# Patient Record
Sex: Female | Born: 1960 | Race: White | Hispanic: No | Marital: Married | Smoking: Former smoker
Health system: Southern US, Community
[De-identification: ages and names within clinical notes are randomized; demographics above are authoritative.]

## PROBLEM LIST (undated history)

## (undated) DIAGNOSIS — E119 Type 2 diabetes mellitus without complications: Secondary | ICD-10-CM

## (undated) DIAGNOSIS — K5090 Crohn's disease, unspecified, without complications: Secondary | ICD-10-CM

## (undated) HISTORY — PX: PARTIAL HYSTERECTOMY: SHX80

## (undated) HISTORY — DX: Type 2 diabetes mellitus without complications: E11.9

## (undated) HISTORY — PX: GASTRIC BYPASS: SHX52

## (undated) HISTORY — PX: CHOLECYSTECTOMY: SHX55

## (undated) HISTORY — DX: Crohn's disease, unspecified, without complications: K50.90

## (undated) HISTORY — PX: CARDIAC CATHETERIZATION: SHX172

---

## 2000-09-10 ENCOUNTER — Inpatient Hospital Stay (HOSPITAL_COMMUNITY): Admission: AD | Admit: 2000-09-10 | Discharge: 2000-09-11 | Payer: Self-pay | Admitting: Cardiology

## 2001-01-31 ENCOUNTER — Inpatient Hospital Stay (HOSPITAL_COMMUNITY): Admission: EM | Admit: 2001-01-31 | Discharge: 2001-02-01 | Payer: Self-pay | Admitting: Emergency Medicine

## 2002-04-20 ENCOUNTER — Encounter: Payer: Self-pay | Admitting: Pediatrics

## 2002-04-20 ENCOUNTER — Ambulatory Visit (HOSPITAL_COMMUNITY): Admission: RE | Admit: 2002-04-20 | Discharge: 2002-04-20 | Payer: Self-pay | Admitting: *Deleted

## 2002-04-25 ENCOUNTER — Ambulatory Visit (HOSPITAL_COMMUNITY): Admission: RE | Admit: 2002-04-25 | Discharge: 2002-04-25 | Payer: Self-pay | Admitting: Interventional Radiology

## 2002-06-02 ENCOUNTER — Observation Stay (HOSPITAL_COMMUNITY): Admission: RE | Admit: 2002-06-02 | Discharge: 2002-06-03 | Payer: Self-pay | Admitting: Interventional Radiology

## 2003-10-15 ENCOUNTER — Encounter: Payer: Self-pay | Admitting: Cardiology

## 2003-10-15 ENCOUNTER — Ambulatory Visit (HOSPITAL_COMMUNITY): Admission: RE | Admit: 2003-10-15 | Discharge: 2003-10-15 | Payer: Self-pay | Admitting: Cardiology

## 2006-06-17 ENCOUNTER — Ambulatory Visit: Payer: Self-pay | Admitting: Cardiology

## 2006-06-17 ENCOUNTER — Inpatient Hospital Stay (HOSPITAL_COMMUNITY): Admission: EM | Admit: 2006-06-17 | Discharge: 2006-06-18 | Payer: Self-pay | Admitting: Emergency Medicine

## 2012-02-19 DIAGNOSIS — I219 Acute myocardial infarction, unspecified: Secondary | ICD-10-CM

## 2012-02-19 HISTORY — DX: Acute myocardial infarction, unspecified: I21.9

## 2015-10-01 DIAGNOSIS — E785 Hyperlipidemia, unspecified: Secondary | ICD-10-CM

## 2015-10-01 DIAGNOSIS — I10 Essential (primary) hypertension: Secondary | ICD-10-CM

## 2015-10-01 DIAGNOSIS — I25119 Atherosclerotic heart disease of native coronary artery with unspecified angina pectoris: Secondary | ICD-10-CM

## 2015-10-01 HISTORY — DX: Atherosclerotic heart disease of native coronary artery with unspecified angina pectoris: I25.119

## 2015-10-01 HISTORY — DX: Hyperlipidemia, unspecified: E78.5

## 2015-10-01 HISTORY — DX: Essential (primary) hypertension: I10

## 2015-10-04 DIAGNOSIS — I200 Unstable angina: Secondary | ICD-10-CM

## 2015-10-04 DIAGNOSIS — R079 Chest pain, unspecified: Secondary | ICD-10-CM

## 2015-10-04 DIAGNOSIS — I209 Angina pectoris, unspecified: Secondary | ICD-10-CM | POA: Insufficient documentation

## 2015-10-04 HISTORY — DX: Angina pectoris, unspecified: I20.9

## 2015-10-04 HISTORY — DX: Chest pain, unspecified: R07.9

## 2015-10-04 HISTORY — DX: Unstable angina: I20.0

## 2020-01-04 ENCOUNTER — Ambulatory Visit (INDEPENDENT_AMBULATORY_CARE_PROVIDER_SITE_OTHER): Payer: Medicaid Other | Admitting: Cardiology

## 2020-01-04 ENCOUNTER — Other Ambulatory Visit: Payer: Self-pay

## 2020-01-04 ENCOUNTER — Encounter: Payer: Self-pay | Admitting: *Deleted

## 2020-01-04 VITALS — BP 150/80 | HR 67 | Ht 62.5 in | Wt 179.0 lb

## 2020-01-04 DIAGNOSIS — E108 Type 1 diabetes mellitus with unspecified complications: Secondary | ICD-10-CM

## 2020-01-04 DIAGNOSIS — I10 Essential (primary) hypertension: Secondary | ICD-10-CM | POA: Diagnosis not present

## 2020-01-04 DIAGNOSIS — I25119 Atherosclerotic heart disease of native coronary artery with unspecified angina pectoris: Secondary | ICD-10-CM | POA: Diagnosis not present

## 2020-01-04 DIAGNOSIS — Z01812 Encounter for preprocedural laboratory examination: Secondary | ICD-10-CM

## 2020-01-04 DIAGNOSIS — R0602 Shortness of breath: Secondary | ICD-10-CM

## 2020-01-04 HISTORY — DX: Type 1 diabetes mellitus with unspecified complications: E10.8

## 2020-01-04 HISTORY — DX: Shortness of breath: R06.02

## 2020-01-04 MED ORDER — LISINOPRIL 5 MG PO TABS: 5.0000 mg | ORAL_TABLET | Freq: Every day | ORAL | 3 refills | Status: DC

## 2020-01-04 MED ORDER — RANOLAZINE ER 500 MG PO TB12: 500.0000 mg | ORAL_TABLET | Freq: Two times a day (BID) | ORAL | 1 refills | Status: DC

## 2020-01-04 NOTE — Patient Instructions (Addendum)
Medication Instructions:  Your physician has recommended you make the following change in your medication:   INCREASE: Lisinopril to 5 mg Take 1 tab daily( may take 2 tabs of 2.5 mg daily unitl you run out)  START; Ranexa 500 mg Take 1 tab Twice dialy  *If you need a refill on your cardiac medications before your next appointment, please call your pharmacy*  Lab Work: Your physician recommends that you return for lab work in: [place] CBC,BMP  If you have labs (blood work) drawn today and your tests are completely normal, you will receive your results only by: Marland Kitchen MyChart Message (if you have MyChart) OR . A paper copy in the mail If you have any lab test that is abnormal or we need to change your treatment, we will call you to review the results.  Testing/Procedures: Your physician has requested that you have an echocardiogram. Echocardiography is a painless test that uses sound waves to create images of your heart. It provides your doctor with information about the size and shape of your heart and how well your heart's chambers and valves are working. This procedure takes approximately one hour. There are no restrictions for this procedure.     [place] [address] [address] Dept: [phone] Loc: [phone]  Kelsey Gillespie  01/04/2020  You are scheduled for a Cardiac Catheterization on Thursday, January 21 with Dr. Glenetta Hew.  1. Please arrive at the [facility] (Main Entrance A) at [facility]: [address] [place], [place] [address] at 5:30 AM (This time is two hours before your procedure to ensure your preparation). Free valet parking service is available.   Special note: Every effort is made to have your procedure done on time. Please understand that emergencies sometimes delay scheduled procedures.  2. Diet: Do not eat solid foods after midnight.  The patient may have  clear liquids until 5am upon the day of the procedure.  3. Labs: You will need to have a COVID screen. Your appointment is on Mon Jan 18th at 1:55PM at [facility]   4. Medication instructions in preparation for your procedure:   Contrast Allergy: No  Take only 50 units of insulin the night before your procedure. Do not take any insulin on the day of the procedure.  Do not take Diabetes Med Glucophage (Metformin) on the day of the procedure and HOLD 48 HOURS AFTER THE PROCEDURE.  On the morning of your procedure, take your Aspirin and any morning medicines NOT listed above.  You may use sips of water.  5. Plan for one night stay--bring personal belongings. 6. Bring a current list of your medications and current insurance cards. 7. You MUST have a responsible person to drive you home. 8. Someone MUST be with you the first 24 hours after you arrive home or your discharge will be delayed. 9. Please wear clothes that are easy to get on and off and wear slip-on shoes.  Thank you for allowing [place] to care for you!   -- [place] [facility]  Follow-Up: At [facility], you and your health needs are our priority.  As part of our continuing mission to provide you with exceptional heart care, we have created designated Provider Care Teams.  These Care Teams include your primary Cardiologist (physician) and Advanced Practice Providers (APPs -  Physician Assistants and Nurse Practitioners) who all work together to provide  you with the care you need, when you need it.  Your next appointment:   1 month(s)  The format for your next appointment:   In Person  Provider:   Thomasene Ripple, DO  Other Instructions

## 2020-01-04 NOTE — Progress Notes (Signed)
Cardiology Office Note:    Date:  01/04/2020   ID:  Kelsey Gillespie, DOB [birth year], MRN [id number]  PCP:  Penelope Coop, FNP  Cardiologist:  No primary care provider on file.  Electrophysiologist:  None   Referring MD: Ocie Doyne., MD   Chief Complaint  Patient presents with  . Coronary Artery Disease    History of Present Illness:    Kelsey Gillespie is a 59 y.o. female with a hx of coronary disease status post stent to the RCA in 2013, per records in 2016 she was cath which showed nonobstructive CAD with mild restenosis of the RCA, and left circumflex artery with mild stenosis.  She was recommended to continue medical management.  Her medical history also includes diabetes, hypertension and hyperlipidemia.  The patient was referred by her PCP to be reevaluated by cardiology due to more frequent intermittent chest pain.  She tells me for the last month or 2 she has been experiencing left-sided squeezing pain which radiated up and down her left chest wall.  She admits to associated shortness of breath.  Last several weeks he has been taking nitroglycerin at least twice every week.  He has concerned about this as he started to feel as the symptoms she experienced prior to her myocardial infarction.  She denies any lightheadedness or dizziness.   Past Medical History:  Diagnosis Date  . Angina pectoris ([place]) 10/04/2015  . Chest pain 10/04/2015  . Coronary artery disease involving native coronary artery with angina pectoris ([place]) 10/01/2015   PCI for acute RCA occlusion 03/09/12 EF 55% Cardiac cath 10/04/15:Conclusions Diagnostic Procedure Summary 1. Nonobstructive CAD. Mild restenosis of RCA and LCx. 2. Low-normal LV systolic function, EF 10% 3. Inferior hypokinesis. Diagnostic Procedure Recommendations Consider noncardiac causes of the patient's symptoms. Continue medical therapy for nonobstructive CAD. Signatures  . Crohn disease ([place])   . Diabetes ([place])   . Essential hypertension  10/01/2015  . Heart attack ([place]) 02/2012  . Hyperlipidemia 10/01/2015    Past Surgical History:  Procedure Laterality Date  . CARDIAC CATHETERIZATION     1 stent placed  . CESAREAN SECTION    . CHOLECYSTECTOMY    . GASTRIC BYPASS    . PARTIAL HYSTERECTOMY      Current Medications: Current Meds  Medication Sig  . ALPRAZolam (XANAX) 1 MG tablet Take 1 mg by mouth as needed.  Marland Kitchen aspirin EC 81 MG tablet Take 81 mg by mouth daily.  . carvedilol (COREG) 3.125 MG tablet Take 40 mg by mouth daily.  . Exenatide ER (BYDUREON) 2 MG PEN Inject into the skin once a week.  . gabapentin (NEURONTIN) 300 MG capsule Take 300 mg by mouth 3 (three) times daily.  . insulin detemir (LEVEMIR) 100 UNIT/ML injection Inject into the skin daily.  . metFORMIN (GLUCOPHAGE) 500 MG tablet Take 1,000 mg by mouth 2 (two) times daily.  . nitroGLYCERIN (NITROSTAT) 0.4 MG SL tablet Place 0.4 mg under the tongue every 5 (five) minutes as needed for chest pain.  Marland Kitchen omeprazole (PRILOSEC) 40 MG capsule Take 40 mg by mouth 2 (two) times daily.  . [DISCONTINUED] lisinopril (ZESTRIL) 2.5 MG tablet Take 2.5 mg by mouth daily.     Allergies:   Codeine   Social History   Socioeconomic History  . Marital status: Married    Spouse name: Not on file  . Number of children: Not on file  . Years of education: Not on file  . Highest education level: Not on  file  Occupational History  . Not on file  Tobacco Use  . Smoking status: Former Smoker    Types: Cigarettes    Quit date: 01/03/1995    Years since quitting: 25.0  . Smokeless tobacco: Never Used  Substance and Sexual Activity  . Alcohol use: Never  . Drug use: Never  . Sexual activity: Not on file  Other Topics Concern  . Not on file  Social History Narrative  . Not on file   Social Determinants of Health   Financial Resource Strain:   . Difficulty of Paying Living Expenses: Not on file  Food Insecurity:   . Worried About Programme researcher, broadcasting/film/video in the Last  Year: Not on file  . Ran Out of Food in the Last Year: Not on file  Transportation Needs:   . Lack of Transportation (Medical): Not on file  . Lack of Transportation (Non-Medical): Not on file  Physical Activity:   . Days of Exercise per Week: Not on file  . Minutes of Exercise per Session: Not on file  Stress:   . Feeling of Stress : Not on file  Social Connections:   . Frequency of Communication with Friends and Family: Not on file  . Frequency of Social Gatherings with Friends and Family: Not on file  . Attends Religious Services: Not on file  . Active Member of Clubs or Organizations: Not on file  . Attends Banker Meetings: Not on file  . Marital Status: Not on file     Family History: The patient's family history includes Atrial fibrillation in her brother; Diabetes in her maternal grandfather and mother; Heart attack in her father and maternal grandmother; Heart disease in her brother; Stroke in her mother.  ROS:   Review of Systems  Constitution: Negative for decreased appetite, fever and weight gain.  HENT: Negative for congestion, ear discharge, hoarse voice and sore throat.   Eyes: Negative for discharge, redness, vision loss in right eye and visual halos.  Cardiovascular: Reports for chest pain, dyspnea on exertion.  Negative for leg swelling, orthopnea and palpitations.  Respiratory: Negative for cough, hemoptysis, shortness of breath and snoring.   Endocrine: Negative for heat intolerance and polyphagia.  Hematologic/Lymphatic: Negative for bleeding problem. Does not bruise/bleed easily.  Skin: Negative for flushing, nail changes, rash and suspicious lesions.  Musculoskeletal: Negative for arthritis, joint pain, muscle cramps, myalgias, neck pain and stiffness.  Gastrointestinal: Negative for abdominal pain, bowel incontinence, diarrhea and excessive appetite.  Genitourinary: Negative for decreased libido, genital sores and incomplete emptying.    Neurological: Negative for brief paralysis, focal weakness, headaches and loss of balance.  Psychiatric/Behavioral: Negative for altered mental status, depression and suicidal ideas.  Allergic/Immunologic: Negative for HIV exposure and persistent infections.    EKGs/Labs/Other Studies Reviewed:    The following studies were reviewed today:   EKG:  The ekg ordered today demonstrates sinus rhythm, heart rate 67 bpm, T wave inversion in the inferior leads consistent with inferior wall ischemia.  No prior EKG for comparison.  Recent Labs: No results found for requested labs within last 8760 hours.  Recent Lipid Panel No results found for: CHOL, TRIG, HDL, CHOLHDL, VLDL, LDLCALC, LDLDIRECT  Physical Exam:    VS:  BP (!) 150/80 (BP Location: Left Arm, Patient Position: Sitting, Cuff Size: Normal)   Pulse 67   Ht 5' 2.5" (1.588 m)   Wt 179 lb (81.2 kg)   SpO2 99%   BMI 32.22 kg/m  Wt Readings from Last 3 Encounters:  01/04/20 179 lb (81.2 kg)     GEN: Well nourished, well developed in no acute distress HEENT: Normal NECK: No JVD; No carotid bruits LYMPHATICS: No lymphadenopathy CARDIAC: S1S2 noted,RRR, no murmurs, rubs, gallops RESPIRATORY:  Clear to auscultation without rales, wheezing or rhonchi  ABDOMEN: Soft, non-tender, non-distended, +bowel sounds, no guarding. EXTREMITIES: No edema, No cyanosis, no clubbing MUSCULOSKELETAL:  No edema; No deformity  SKIN: Warm and dry NEUROLOGIC:  Alert and oriented x 3, non-focal PSYCHIATRIC:  Normal affect, good insight  ASSESSMENT:    1. Coronary artery disease involving native coronary artery with angina pectoris, unspecified whether native or transplanted heart (HCC)   2. Essential hypertension   3. Shortness of breath   4. Pre-procedure lab exam   5. Type 1 diabetes mellitus with complications (HCC)    PLAN:    1.  Coronary artery disease status post stents-her symptoms are consistent with chronic stable angina.  I am  going to add ranolazine 500 mg twice daily to the patient's medication regimen to help this.  In addition given her risk factors significantly high and with nonobstructive coronary artery disease already noted on 2016 left heart cath the most appropriate ischemic evaluation for this patient will be a left heart catheterization.  The patient understands that risks include but are not limited to stroke (1 in 1000), death (1 in 1000), kidney failure [usually temporary] (1 in 500), bleeding (1 in 200), allergic reaction [possibly serious] (1 in 200), and agrees to proceed.  2.  For completed for shortness of breath we will reassess her LV function and assessing for any structural abnormalities by getting a transthoracic echocardiogram.  Per record in 2016 her EF was 50%.  3.  She is hypertensive in the office today, therefore I am going to increase her lisinopril to 5 mg daily.  I have encouraged the patient to increase her fluid intake especially with her upcoming left heart catheterization.   The patient is in agreement with the above plan. The patient left the office in stable condition.  The patient will follow up in 1 month.   Medication Adjustments/Labs and Tests Ordered: Current medicines are reviewed at length with the patient today.  Concerns regarding medicines are outlined above.  Orders Placed This Encounter  Procedures  . CBC  . Basic Metabolic Panel (BMET)  . EKG 12-Lead  . ECHOCARDIOGRAM COMPLETE   Meds ordered this encounter  Medications  . lisinopril (ZESTRIL) 5 MG tablet    Sig: Take 1 tablet (5 mg total) by mouth daily.    Dispense:  90 tablet    Refill:  3  . ranolazine (RANEXA) 500 MG 12 hr tablet    Sig: Take 1 tablet (500 mg total) by mouth 2 (two) times daily.    Dispense:  180 tablet    Refill:  1    Patient Instructions  Medication Instructions:  Your physician has recommended you make the following change in your medication:   INCREASE: Lisinopril to 5 mg  Take 1 tab daily( may take 2 tabs of 2.5 mg daily unitl you run out)  START; Ranexa 500 mg Take 1 tab Twice dialy  *If you need a refill on your cardiac medications before your next appointment, please call your pharmacy*  Lab Work: Your physician recommends that you return for lab work in: TODAY CBC,BMP  If you have labs (blood work) drawn today and your tests are completely normal, you will receive  your results only by: Marland Kitchen MyChart Message (if you have MyChart) OR . A paper copy in the mail If you have any lab test that is abnormal or we need to change your treatment, we will call you to review the results.  Testing/Procedures: Your physician has requested that you have an echocardiogram. Echocardiography is a painless test that uses sound waves to create images of your heart. It provides your doctor with information about the size and shape of your heart and how well your heart's chambers and valves are working. This procedure takes approximately one hour. There are no restrictions for this procedure.     [facility] CARDIOVASCULAR DIVISION [facility] AT [place] [address] [place] [address] Dept: [phone] Loc: [phone]  Jiah Bari  01/04/2020  You are scheduled for a Cardiac Catheterization on Thursday, January 21 with Dr. Bryan Lemma.  1. Please arrive at the [facility] (Main Entrance A) at [facility]: [address] [place], [place] [address] at 5:30 AM (This time is two hours before your procedure to ensure your preparation). Free valet parking service is available.   Special note: Every effort is made to have your procedure done on time. Please understand that emergencies sometimes delay scheduled procedures.  2. Diet: Do not eat solid foods after midnight.  The patient may have clear liquids until 5am upon the day of the procedure.  3. Labs: You will need to have a COVID screen. Your appointment is on Mon Jan 18th  at 1:55PM at [address] [facility] [place]  4. Medication instructions in preparation for your procedure:   Contrast Allergy: No  Take only 50 units of insulin the night before your procedure. Do not take any insulin on the day of the procedure.  Do not take Diabetes Med Glucophage (Metformin) on the day of the procedure and HOLD 48 HOURS AFTER THE PROCEDURE.  On the morning of your procedure, take your Aspirin and any morning medicines NOT listed above.  You may use sips of water.  5. Plan for one night stay--bring personal belongings. 6. Bring a current list of your medications and current insurance cards. 7. You MUST have a responsible person to drive you home. 8. Someone MUST be with you the first 24 hours after you arrive home or your discharge will be delayed. 9. Please wear clothes that are easy to get on and off and wear slip-on shoes.  Thank you for allowing [place] to care for you!   -- [place] [facility]  Follow-Up: At [facility], you and your health needs are our priority.  As part of our continuing mission to provide you with exceptional heart care, we have created designated Provider Care Teams.  These Care Teams include your primary Cardiologist (physician) and Advanced Practice Providers (APPs -  Physician Assistants and Nurse Practitioners) who all work together to provide you with the care you need, when you need it.  Your next appointment:   1 month(s)  The format for your next appointment:   In Person  Provider:   Thomasene Ripple, DO  Other Instructions      Adopting a Healthy Lifestyle.  Know what a healthy weight is for you (roughly BMI <25) and aim to maintain this   Aim for 7+ servings of fruits and vegetables daily   65-80+ fluid ounces of water or unsweet tea for healthy kidneys   Limit to max 1 drink of alcohol per  day; avoid smoking/tobacco   Limit animal fats in diet for cholesterol and heart health - choose grass  fed whenever available   Avoid highly processed foods, and foods high in saturated/trans fats   Aim for low stress - take time to unwind and care for your mental health   Aim for 150 min of moderate intensity exercise weekly for heart health, and weights twice weekly for bone health   Aim for 7-9 hours of sleep daily   When it comes to diets, agreement about the perfect plan isnt easy to find, even among the experts. Experts at the [facility] [employer] developed an idea known as the Healthy Eating Plate. Just imagine a plate divided into logical, healthy portions.   The emphasis is on diet quality:   Load up on vegetables and fruits - one-half of your plate: Aim for color and variety, and remember that potatoes dont count.   Go for whole grains - one-quarter of your plate: Whole wheat, barley, wheat berries, quinoa, oats, Melody rice, and foods made with them. If you want pasta, go with whole wheat pasta.   Protein power - one-quarter of your plate: Fish, chicken, beans, and nuts are all healthy, versatile protein sources. Limit red meat.   The diet, however, does go beyond the plate, offering a few other suggestions.   Use healthy plant oils, such as olive, canola, soy, corn, sunflower and peanut. Check the labels, and avoid partially hydrogenated oil, which have unhealthy trans fats.   If youre thirsty, drink water. Coffee and tea are good in moderation, but skip sugary drinks and limit milk and dairy products to one or two daily servings.   The type of carbohydrate in the diet is more important than the amount. Some sources of carbohydrates, such as vegetables, fruits, whole grains, and beans-are healthier than others.   Finally, stay active  Signed, Thomasene Ripple, DO  01/04/2020 12:11 PM    [facility]

## 2020-01-04 NOTE — H&P (View-Only) (Signed)
Cardiology Office Note:    Date:  01/04/2020   ID:  Kelsey Gillespie, DOB [birth year], MRN [id number]  PCP:  Kelsey Coop, FNP  Cardiologist:  No primary care provider on file.  Electrophysiologist:  None   Referring MD: Kelsey Gillespie., MD   Chief Complaint  Patient presents with  . Coronary Artery Disease    History of Present Illness:    Kelsey Gillespie is a 59 y.o. female with a hx of coronary disease status post stent to the RCA in 2013, per records in 2016 she was cath which showed nonobstructive CAD with mild restenosis of the RCA, and left circumflex artery with mild stenosis.  She was recommended to continue medical management.  Her medical history also includes diabetes, hypertension and hyperlipidemia.  The patient was referred by her PCP to be reevaluated by cardiology due to more frequent intermittent chest pain.  She tells me for the last month or 2 she has been experiencing left-sided squeezing pain which radiated up and down her left chest wall.  She admits to associated shortness of breath.  Last several weeks he has been taking nitroglycerin at least twice every week.  He has concerned about this as he started to feel as the symptoms she experienced prior to her myocardial infarction.  She denies any lightheadedness or dizziness.   Past Medical History:  Diagnosis Date  . Angina pectoris ([place]) 10/04/2015  . Chest pain 10/04/2015  . Coronary artery disease involving native coronary artery with angina pectoris ([place]) 10/01/2015   PCI for acute RCA occlusion 03/09/12 EF 55% Cardiac cath 10/04/15:Conclusions Diagnostic Procedure Summary 1. Nonobstructive CAD. Mild restenosis of RCA and LCx. 2. Low-normal LV systolic function, EF 10% 3. Inferior hypokinesis. Diagnostic Procedure Recommendations Consider noncardiac causes of the patient's symptoms. Continue medical therapy for nonobstructive CAD. Signatures  . Crohn disease ([place])   . Diabetes ([place])   . Essential hypertension  10/01/2015  . Heart attack ([place]) 02/2012  . Hyperlipidemia 10/01/2015    Past Surgical History:  Procedure Laterality Date  . CARDIAC CATHETERIZATION     1 stent placed  . CESAREAN SECTION    . CHOLECYSTECTOMY    . GASTRIC BYPASS    . PARTIAL HYSTERECTOMY      Current Medications: Current Meds  Medication Sig  . ALPRAZolam (XANAX) 1 MG tablet Take 1 mg by mouth as needed.  Marland Kitchen aspirin EC 81 MG tablet Take 81 mg by mouth daily.  . carvedilol (COREG) 3.125 MG tablet Take 40 mg by mouth daily.  . Exenatide ER (BYDUREON) 2 MG PEN Inject into the skin once a week.  . gabapentin (NEURONTIN) 300 MG capsule Take 300 mg by mouth 3 (three) times daily.  . insulin detemir (LEVEMIR) 100 UNIT/ML injection Inject into the skin daily.  . metFORMIN (GLUCOPHAGE) 500 MG tablet Take 1,000 mg by mouth 2 (two) times daily.  . nitroGLYCERIN (NITROSTAT) 0.4 MG SL tablet Place 0.4 mg under the tongue every 5 (five) minutes as needed for chest pain.  Marland Kitchen omeprazole (PRILOSEC) 40 MG capsule Take 40 mg by mouth 2 (two) times daily.  . [DISCONTINUED] lisinopril (ZESTRIL) 2.5 MG tablet Take 2.5 mg by mouth daily.     Allergies:   Codeine   Social History   Socioeconomic History  . Marital status: Married    Spouse name: Not on file  . Number of children: Not on file  . Years of education: Not on file  . Highest education level: Not on  file  Occupational History  . Not on file  Tobacco Use  . Smoking status: Former Smoker    Types: Cigarettes    Quit date: 01/03/1995    Years since quitting: 25.0  . Smokeless tobacco: Never Used  Substance and Sexual Activity  . Alcohol use: Never  . Drug use: Never  . Sexual activity: Not on file  Other Topics Concern  . Not on file  Social History Narrative  . Not on file   Social Determinants of Health   Financial Resource Strain:   . Difficulty of Paying Living Expenses: Not on file  Food Insecurity:   . Worried About Programme researcher, broadcasting/film/video in the Last  Year: Not on file  . Ran Out of Food in the Last Year: Not on file  Transportation Needs:   . Lack of Transportation (Medical): Not on file  . Lack of Transportation (Non-Medical): Not on file  Physical Activity:   . Days of Exercise per Week: Not on file  . Minutes of Exercise per Session: Not on file  Stress:   . Feeling of Stress : Not on file  Social Connections:   . Frequency of Communication with Friends and Family: Not on file  . Frequency of Social Gatherings with Friends and Family: Not on file  . Attends Religious Services: Not on file  . Active Member of Clubs or Organizations: Not on file  . Attends Banker Meetings: Not on file  . Marital Status: Not on file     Family History: The patient's family history includes Atrial fibrillation in her brother; Diabetes in her maternal grandfather and mother; Heart attack in her father and maternal grandmother; Heart disease in her brother; Stroke in her mother.  ROS:   Review of Systems  Constitution: Negative for decreased appetite, fever and weight gain.  HENT: Negative for congestion, ear discharge, hoarse voice and sore throat.   Eyes: Negative for discharge, redness, vision loss in right eye and visual halos.  Cardiovascular: Reports for chest pain, dyspnea on exertion.  Negative for leg swelling, orthopnea and palpitations.  Respiratory: Negative for cough, hemoptysis, shortness of breath and snoring.   Endocrine: Negative for heat intolerance and polyphagia.  Hematologic/Lymphatic: Negative for bleeding problem. Does not bruise/bleed easily.  Skin: Negative for flushing, nail changes, rash and suspicious lesions.  Musculoskeletal: Negative for arthritis, joint pain, muscle cramps, myalgias, neck pain and stiffness.  Gastrointestinal: Negative for abdominal pain, bowel incontinence, diarrhea and excessive appetite.  Genitourinary: Negative for decreased libido, genital sores and incomplete emptying.    Neurological: Negative for brief paralysis, focal weakness, headaches and loss of balance.  Psychiatric/Behavioral: Negative for altered mental status, depression and suicidal ideas.  Allergic/Immunologic: Negative for HIV exposure and persistent infections.    EKGs/Labs/Other Studies Reviewed:    The following studies were reviewed today:   EKG:  The ekg ordered today demonstrates sinus rhythm, heart rate 67 bpm, T wave inversion in the inferior leads consistent with inferior wall ischemia.  No prior EKG for comparison.  Recent Labs: No results found for requested labs within last 8760 hours.  Recent Lipid Panel No results found for: CHOL, TRIG, HDL, CHOLHDL, VLDL, LDLCALC, LDLDIRECT  Physical Exam:    VS:  BP (!) 150/80 (BP Location: Left Arm, Patient Position: Sitting, Cuff Size: Normal)   Pulse 67   Ht 5' 2.5" (1.588 m)   Wt 179 lb (81.2 kg)   SpO2 99%   BMI 32.22 kg/m  Wt Readings from Last 3 Encounters:  01/04/20 179 lb (81.2 kg)     GEN: Well nourished, well developed in no acute distress HEENT: Normal NECK: No JVD; No carotid bruits LYMPHATICS: No lymphadenopathy CARDIAC: S1S2 noted,RRR, no murmurs, rubs, gallops RESPIRATORY:  Clear to auscultation without rales, wheezing or rhonchi  ABDOMEN: Soft, non-tender, non-distended, +bowel sounds, no guarding. EXTREMITIES: No edema, No cyanosis, no clubbing MUSCULOSKELETAL:  No edema; No deformity  SKIN: Warm and dry NEUROLOGIC:  Alert and oriented x 3, non-focal PSYCHIATRIC:  Normal affect, good insight  ASSESSMENT:    1. Coronary artery disease involving native coronary artery with angina pectoris, unspecified whether native or transplanted heart (HCC)   2. Essential hypertension   3. Shortness of breath   4. Pre-procedure lab exam   5. Type 1 diabetes mellitus with complications (HCC)    PLAN:    1.  Coronary artery disease status post stents-her symptoms are consistent with chronic stable angina.  I am  going to add ranolazine 500 mg twice daily to the patient's medication regimen to help this.  In addition given her risk factors significantly high and with nonobstructive coronary artery disease already noted on 2016 left heart cath the most appropriate ischemic evaluation for this patient will be a left heart catheterization.  The patient understands that risks include but are not limited to stroke (1 in 1000), death (1 in 1000), kidney failure [usually temporary] (1 in 500), bleeding (1 in 200), allergic reaction [possibly serious] (1 in 200), and agrees to proceed.  2.  For completed for shortness of breath we will reassess her LV function and assessing for any structural abnormalities by getting a transthoracic echocardiogram.  Per record in 2016 her EF was 50%.  3.  She is hypertensive in the office today, therefore I am going to increase her lisinopril to 5 mg daily.  I have encouraged the patient to increase her fluid intake especially with her upcoming left heart catheterization.   The patient is in agreement with the above plan. The patient left the office in stable condition.  The patient will follow up in 1 month.   Medication Adjustments/Labs and Tests Ordered: Current medicines are reviewed at length with the patient today.  Concerns regarding medicines are outlined above.  Orders Placed This Encounter  Procedures  . CBC  . Basic Metabolic Panel (BMET)  . EKG 12-Lead  . ECHOCARDIOGRAM COMPLETE   Meds ordered this encounter  Medications  . lisinopril (ZESTRIL) 5 MG tablet    Sig: Take 1 tablet (5 mg total) by mouth daily.    Dispense:  90 tablet    Refill:  3  . ranolazine (RANEXA) 500 MG 12 hr tablet    Sig: Take 1 tablet (500 mg total) by mouth 2 (two) times daily.    Dispense:  180 tablet    Refill:  1    Patient Instructions  Medication Instructions:  Your physician has recommended you make the following change in your medication:   INCREASE: Lisinopril to 5 mg  Take 1 tab daily( may take 2 tabs of 2.5 mg daily unitl you run out)  START; Ranexa 500 mg Take 1 tab Twice dialy  *If you need a refill on your cardiac medications before your next appointment, please call your pharmacy*  Lab Work: Your physician recommends that you return for lab work in: TODAY CBC,BMP  If you have labs (blood work) drawn today and your tests are completely normal, you will receive  your results only by: . MyChart Message (if you have MyChart) OR . A paper copy in the mail If you have any lab test that is abnormal or we need to change your treatment, we will call you to review the results.  Testing/Procedures: Your physician has requested that you have an echocardiogram. Echocardiography is a painless test that uses sound waves to create images of your heart. It provides your doctor with information about the size and shape of your heart and how well your heart's chambers and valves are working. This procedure takes approximately one hour. There are no restrictions for this procedure.     [facility] [address] [place] [address] Dept: [phone] Loc: [phone]  Kelsey Gillespie  01/04/2020  You are scheduled for a Cardiac Catheterization on Thursday, January 21 with Dr. David Harding.  1. Please arrive at the [facility] (Main Entrance A) at [facility]: [address] [place], [place] [address] at 5:30 AM (This time is two hours before your procedure to ensure your preparation). Free valet parking service is available.   Special note: Every effort is made to have your procedure done on time. Please understand that emergencies sometimes delay scheduled procedures.  2. Diet: Do not eat solid foods after midnight.  The patient may have clear liquids until 5am upon the day of the procedure.  3. Labs: You will need to have a COVID screen. Your appointment is on Mon Jan 18th  at 1:55PM at [address] [place]  4. Medication instructions in preparation for your procedure:   Contrast Allergy: No  Take only 50 units of insulin the night before your procedure. Do not take any insulin on the day of the procedure.  Do not take Diabetes Med Glucophage (Metformin) on the day of the procedure and HOLD 48 HOURS AFTER THE PROCEDURE.  On the morning of your procedure, take your Aspirin and any morning medicines NOT listed above.  You may use sips of water.  5. Plan for one night stay--bring personal belongings. 6. Bring a current list of your medications and current insurance cards. 7. You MUST have a responsible person to drive you home. 8. Someone MUST be with you the first 24 hours after you arrive home or your discharge will be delayed. 9. Please wear clothes that are easy to get on and off and wear slip-on shoes.  Thank you for allowing us to care for you!   -- [facility]  Follow-Up: At [facility], you and your health needs are our priority.  As part of our continuing mission to provide you with exceptional heart care, we have created designated Provider Care Teams.  These Care Teams include your primary Cardiologist (physician) and Advanced Practice Providers (APPs -  Physician Assistants and Nurse Practitioners) who all work together to provide you with the care you need, when you need it.  Your next appointment:   1 month(s)  The format for your next appointment:   In Person  Provider:   Kelsey Pizano, DO  Other Instructions      Adopting a Healthy Lifestyle.  Know what a healthy weight is for you (roughly BMI <25) and aim to maintain this   Aim for 7+ servings of fruits and vegetables daily   65-80+ fluid ounces of water or unsweet tea for healthy kidneys   Limit to max 1 drink of alcohol per   day; avoid smoking/tobacco   Limit animal fats in diet for cholesterol and heart health - choose grass  fed whenever available   Avoid highly processed foods, and foods high in saturated/trans fats   Aim for low stress - take time to unwind and care for your mental health   Aim for 150 min of moderate intensity exercise weekly for heart health, and weights twice weekly for bone health   Aim for 7-9 hours of sleep daily   When it comes to diets, agreement about the perfect plan isnt easy to find, even among the experts. Experts at the [facility] [employer] developed an idea known as the Healthy Eating Plate. Just imagine a plate divided into logical, healthy portions.   The emphasis is on diet quality:   Load up on vegetables and fruits - one-half of your plate: Aim for color and variety, and remember that potatoes dont count.   Go for whole grains - one-quarter of your plate: Whole wheat, barley, wheat berries, quinoa, oats, Melody rice, and foods made with them. If you want pasta, go with whole wheat pasta.   Protein power - one-quarter of your plate: Fish, chicken, beans, and nuts are all healthy, versatile protein sources. Limit red meat.   The diet, however, does go beyond the plate, offering a few other suggestions.   Use healthy plant oils, such as olive, canola, soy, corn, sunflower and peanut. Check the labels, and avoid partially hydrogenated oil, which have unhealthy trans fats.   If youre thirsty, drink water. Coffee and tea are good in moderation, but skip sugary drinks and limit milk and dairy products to one or two daily servings.   The type of carbohydrate in the diet is more important than the amount. Some sources of carbohydrates, such as vegetables, fruits, whole grains, and beans-are healthier than others.   Finally, stay active  Signed, Kelsey Ripple, DO  01/04/2020 12:11 PM    [facility]

## 2020-01-05 LAB — CBC
Hematocrit: 35.4 % (ref 34.0–46.6)
Hemoglobin: 11.9 g/dL (ref 11.1–15.9)
MCH: 27.4 pg (ref 26.6–33.0)
MCHC: 33.6 g/dL (ref 31.5–35.7)
MCV: 81 fL (ref 79–97)
Platelets: 291 10*3/uL (ref 150–450)
RBC: 4.35 x10E6/uL (ref 3.77–5.28)
RDW: 13.3 % (ref 11.7–15.4)
WBC: 5.8 10*3/uL (ref 3.4–10.8)

## 2020-01-05 LAB — BASIC METABOLIC PANEL
BUN/Creatinine Ratio: 15 (ref 9–23)
BUN: 9 mg/dL (ref 6–24)
CO2: 25 mmol/L (ref 20–29)
Calcium: 9.6 mg/dL (ref 8.7–10.2)
Chloride: 105 mmol/L (ref 96–106)
Creatinine, Ser: 0.62 mg/dL (ref 0.57–1.00)
GFR calc Af Amer: 116 mL/min/{1.73_m2} (ref >59)
GFR calc non Af Amer: 100 mL/min/{1.73_m2} (ref >59)
Glucose: 116 mg/dL — ABNORMAL HIGH (ref 65–99)
Potassium: 4.3 mmol/L (ref 3.5–5.2)
Sodium: 142 mmol/L (ref 134–144)

## 2020-01-08 ENCOUNTER — Other Ambulatory Visit (HOSPITAL_COMMUNITY)
Admission: RE | Admit: 2020-01-08 | Discharge: 2020-01-08 | Disposition: A | Discharge status: Home / Self Care | Payer: Medicaid Other | Source: Ambulatory Visit | Attending: Cardiology | Admitting: Cardiology

## 2020-01-08 DIAGNOSIS — Z01812 Encounter for preprocedural laboratory examination: Secondary | ICD-10-CM | POA: Insufficient documentation

## 2020-01-08 DIAGNOSIS — Z20822 Contact with and (suspected) exposure to covid-19: Secondary | ICD-10-CM | POA: Insufficient documentation

## 2020-01-09 ENCOUNTER — Telehealth: Payer: Self-pay | Admitting: *Deleted

## 2020-01-09 LAB — NOVEL CORONAVIRUS, NAA (HOSP ORDER, SEND-OUT TO REF LAB; TAT 18-24 HRS): SARS-CoV-2, NAA: NOT DETECTED

## 2020-01-09 NOTE — Telephone Encounter (Signed)
Pt contacted pre-catheterization scheduled at [facility] for: Thursday January 21,2021 7:30 AM Verified arrival time and place: [facility] Main Entrance A [facility]) at: 5:30 AM   No solid food after midnight prior to cath, clear liquids until 5 AM day of procedure. Contrast allergy: no  Hold: Insulin-1/2 usual insulin PM prior to procedure. Insulin-AM of procedure-I do not see AM Insulin on medication list. Metformin-day of procedure and 48 hours post procedure   Except hold medications AM meds can be  taken pre-cath with sip of water including: ASA 81 mg   Confirmed patient has responsible adult to drive home post procedure and observe 24 hours after arriving home:   Currently, due to Covid-19 pandemic, only one support person will be allowed with patient. Must be the same support person for that patient's entire stay and will be required to wear a mask. They will be asked to wait in the waiting room for the duration of the patient's stay.  Patients are required to wear a mask when they enter the hospital.  01/09/20 unable to complete call to review procedure instructions, received message party unable to accept call.

## 2020-01-10 NOTE — Telephone Encounter (Signed)
I attempted to contact patient to review procedure instructions, received message at home number listed not a working number, received message mobile number listed does not receive anonymous calls, unable to speak with patient.

## 2020-01-11 ENCOUNTER — Other Ambulatory Visit: Payer: Self-pay

## 2020-01-11 ENCOUNTER — Ambulatory Visit (HOSPITAL_COMMUNITY)
Admission: RE | Admit: 2020-01-11 | Discharge: 2020-01-11 | Disposition: A | Discharge status: Home / Self Care | Payer: Medicaid Other | Attending: Cardiology | Admitting: Cardiology

## 2020-01-11 ENCOUNTER — Ambulatory Visit (HOSPITAL_COMMUNITY)
Admission: RE | Disposition: A | Discharge status: Home / Self Care | Payer: Self-pay | Source: Home / Self Care | Attending: Cardiology

## 2020-01-11 DIAGNOSIS — Z7982 Long term (current) use of aspirin: Secondary | ICD-10-CM | POA: Insufficient documentation

## 2020-01-11 DIAGNOSIS — I25118 Atherosclerotic heart disease of native coronary artery with other forms of angina pectoris: Secondary | ICD-10-CM | POA: Diagnosis not present

## 2020-01-11 DIAGNOSIS — I25119 Atherosclerotic heart disease of native coronary artery with unspecified angina pectoris: Secondary | ICD-10-CM | POA: Diagnosis present

## 2020-01-11 DIAGNOSIS — I252 Old myocardial infarction: Secondary | ICD-10-CM | POA: Diagnosis not present

## 2020-01-11 DIAGNOSIS — I10 Essential (primary) hypertension: Secondary | ICD-10-CM | POA: Insufficient documentation

## 2020-01-11 DIAGNOSIS — Z955 Presence of coronary angioplasty implant and graft: Secondary | ICD-10-CM | POA: Diagnosis not present

## 2020-01-11 DIAGNOSIS — Z79899 Other long term (current) drug therapy: Secondary | ICD-10-CM | POA: Insufficient documentation

## 2020-01-11 DIAGNOSIS — E109 Type 1 diabetes mellitus without complications: Secondary | ICD-10-CM | POA: Insufficient documentation

## 2020-01-11 DIAGNOSIS — R0602 Shortness of breath: Secondary | ICD-10-CM | POA: Diagnosis not present

## 2020-01-11 DIAGNOSIS — Z87891 Personal history of nicotine dependence: Secondary | ICD-10-CM | POA: Diagnosis not present

## 2020-01-11 DIAGNOSIS — E785 Hyperlipidemia, unspecified: Secondary | ICD-10-CM | POA: Insufficient documentation

## 2020-01-11 DIAGNOSIS — Z794 Long term (current) use of insulin: Secondary | ICD-10-CM | POA: Insufficient documentation

## 2020-01-11 DIAGNOSIS — I200 Unstable angina: Secondary | ICD-10-CM | POA: Diagnosis present

## 2020-01-11 DIAGNOSIS — Z885 Allergy status to narcotic agent status: Secondary | ICD-10-CM | POA: Diagnosis not present

## 2020-01-11 HISTORY — PX: LEFT HEART CATH AND CORONARY ANGIOGRAPHY: CATH118249

## 2020-01-11 LAB — GLUCOSE, CAPILLARY: Glucose-Capillary: 149 mg/dL — ABNORMAL HIGH (ref 70–99)

## 2020-01-11 SURGERY — LEFT HEART CATH AND CORONARY ANGIOGRAPHY
Anesthesia: LOCAL

## 2020-01-11 MED ORDER — LIDOCAINE HCL (PF) 1 % IJ SOLN
INTRAMUSCULAR | Status: AC
  Filled 2020-01-11: qty 30

## 2020-01-11 MED ORDER — HEPARIN (PORCINE) IN NACL 1000-0.9 UT/500ML-% IV SOLN
INTRAVENOUS | Status: AC
  Filled 2020-01-11: qty 1000

## 2020-01-11 MED ORDER — HEPARIN (PORCINE) IN NACL 1000-0.9 UT/500ML-% IV SOLN
INTRAVENOUS | Status: DC | PRN
  Administered 2020-01-11 (×2): 500 mL

## 2020-01-11 MED ORDER — MIDAZOLAM HCL 2 MG/2ML IJ SOLN
INTRAMUSCULAR | Status: DC | PRN
  Administered 2020-01-11 (×2): 1 mg via INTRAVENOUS

## 2020-01-11 MED ORDER — SODIUM CHLORIDE 0.9% FLUSH: 3.0000 mL | INTRAVENOUS | Status: DC | PRN

## 2020-01-11 MED ORDER — VERAPAMIL HCL 2.5 MG/ML IV SOLN
INTRAVENOUS | Status: DC | PRN
  Administered 2020-01-11: 10 mL via INTRA_ARTERIAL

## 2020-01-11 MED ORDER — SODIUM CHLORIDE 0.9 % WEIGHT BASED INFUSION: 1.0000 mL/kg/h | INTRAVENOUS | Status: DC

## 2020-01-11 MED ORDER — HYDRALAZINE HCL 20 MG/ML IJ SOLN: 10.0000 mg | INTRAMUSCULAR | Status: DC | PRN

## 2020-01-11 MED ORDER — ONDANSETRON HCL 4 MG/2ML IJ SOLN: 4.0000 mg | Freq: Four times a day (QID) | INTRAMUSCULAR | Status: DC | PRN

## 2020-01-11 MED ORDER — SODIUM CHLORIDE 0.9 % IV SOLN: 250.0000 mL | INTRAVENOUS | Status: DC | PRN

## 2020-01-11 MED ORDER — VERAPAMIL HCL 2.5 MG/ML IV SOLN
INTRAVENOUS | Status: AC
  Filled 2020-01-11: qty 2

## 2020-01-11 MED ORDER — IOHEXOL 350 MG/ML SOLN
INTRAVENOUS | Status: DC | PRN
  Administered 2020-01-11: 100 mL

## 2020-01-11 MED ORDER — ASPIRIN 81 MG PO CHEW
CHEWABLE_TABLET | ORAL | Status: AC
  Administered 2020-01-11: 07:00:00 81 mg
  Filled 2020-01-11: qty 1

## 2020-01-11 MED ORDER — FENTANYL CITRATE (PF) 100 MCG/2ML IJ SOLN
INTRAMUSCULAR | Status: AC
  Filled 2020-01-11: qty 2

## 2020-01-11 MED ORDER — SODIUM CHLORIDE 0.9% FLUSH: 3.0000 mL | Freq: Two times a day (BID) | INTRAVENOUS | Status: DC

## 2020-01-11 MED ORDER — FENTANYL CITRATE (PF) 100 MCG/2ML IJ SOLN
INTRAMUSCULAR | Status: DC | PRN
  Administered 2020-01-11 (×2): 25 ug via INTRAVENOUS

## 2020-01-11 MED ORDER — LABETALOL HCL 5 MG/ML IV SOLN: 10.0000 mg | INTRAVENOUS | Status: DC | PRN

## 2020-01-11 MED ORDER — HEPARIN SODIUM (PORCINE) 1000 UNIT/ML IJ SOLN
INTRAMUSCULAR | Status: AC
  Filled 2020-01-11: qty 1

## 2020-01-11 MED ORDER — ACETAMINOPHEN 325 MG PO TABS: 650.0000 mg | ORAL_TABLET | ORAL | Status: DC | PRN

## 2020-01-11 MED ORDER — HEPARIN SODIUM (PORCINE) 1000 UNIT/ML IJ SOLN
INTRAMUSCULAR | Status: DC | PRN
  Administered 2020-01-11: 4000 [IU] via INTRAVENOUS

## 2020-01-11 MED ORDER — SODIUM CHLORIDE 0.9 % WEIGHT BASED INFUSION
3.0000 mL/kg/h | INTRAVENOUS | Status: DC
  Administered 2020-01-11: 06:00:00 3 mL/kg/h via INTRAVENOUS

## 2020-01-11 MED ORDER — LIDOCAINE HCL (PF) 1 % IJ SOLN
INTRAMUSCULAR | Status: DC | PRN
  Administered 2020-01-11: 2 mL via INTRADERMAL

## 2020-01-11 MED ORDER — SODIUM CHLORIDE 0.9 % IV SOLN: INTRAVENOUS | Status: DC

## 2020-01-11 MED ORDER — MIDAZOLAM HCL 2 MG/2ML IJ SOLN
INTRAMUSCULAR | Status: AC
  Filled 2020-01-11: qty 2

## 2020-01-11 SURGICAL SUPPLY — 10 items
CATH INFINITI 5FR ANG PIGTAIL (CATHETERS) ×2 IMPLANT
CATH OPTITORQUE TIG 4.0 5F (CATHETERS) ×2 IMPLANT
DEVICE RAD COMP TR BAND LRG (VASCULAR PRODUCTS) ×2 IMPLANT
GLIDESHEATH SLEND SS 6F .021 (SHEATH) ×2 IMPLANT
GUIDEWIRE INQWIRE 1.5J.035X260 (WIRE) ×1 IMPLANT
INQWIRE 1.5J .035X260CM (WIRE) ×2
KIT HEART LEFT (KITS) ×2 IMPLANT
PACK CARDIAC CATHETERIZATION (CUSTOM PROCEDURE TRAY) ×2 IMPLANT
TRANSDUCER W/STOPCOCK (MISCELLANEOUS) ×2 IMPLANT
TUBING CIL FLEX 10 FLL-RA (TUBING) ×2 IMPLANT

## 2020-01-11 NOTE — Progress Notes (Signed)
Discharge instructions reviewed with pt voices understanding. Attempted to call her husband message left.

## 2020-01-11 NOTE — Progress Notes (Signed)
Discharge instructions reviewed with pt husband voices understanding.  

## 2020-01-11 NOTE — Interval H&P Note (Signed)
History and Physical Interval Note:  01/11/2020 7:30 AM  Kelsey Gillespie  has presented today for surgery, with the diagnosis of CAD -with new onset anginal pain over the last 2 -3 weeks associated with exertional dyspnea.  This would not be considered stable angina, would be considered unstable versus progressive angina over the last month, since she was previously stable since 2016.    The various methods of treatment have been discussed with the patient and family. After consideration of risks, benefits and other options for treatment, the patient has consented to  Procedure(s): LEFT HEART CATH AND CORONARY ANGIOGRAPHY (N/A)  PERCUTANEOUS CORONARY INTERVENTION  as a surgical intervention.  The patient's history has been reviewed, patient examined, no change in status, stable for surgery.  I have reviewed the patient's chart and labs.  Questions were answered to the patient's satisfaction.     Cath Lab Visit (complete for each Cath Lab visit)  Clinical Evaluation Leading to the Procedure:   ACS: No. -New onset pain  Non-ACS:    Anginal Classification: CCS III  Anti-ischemic medical therapy: Maximal Therapy (2 or more classes of medications)  Non-Invasive Test Results: No non-invasive testing performed  Prior CABG: No previous CABG   Bryan Lemma

## 2020-01-11 NOTE — Progress Notes (Signed)
Ambulated in hallway to bathroom to void tol well

## 2020-01-11 NOTE — Discharge Instructions (Signed)
Radial Site Care  This sheet gives you information about how to care for yourself after your procedure. Your health care provider may also give you more specific instructions. If you have problems or questions, contact your health care provider. What can I expect after the procedure? After the procedure, it is common to have:  Bruising and tenderness at the catheter insertion area. Follow these instructions at home: Medicines  Take over-the-counter and prescription medicines only as told by your health care provider. Insertion site care  Follow instructions from your health care provider about how to take care of your insertion site. Make sure you: ? Wash your hands with soap and water before you change your bandage (dressing). If soap and water are not available, use hand sanitizer. ? Change your dressing as told by your health care provider. ? Leave stitches (sutures), skin glue, or adhesive strips in place. These skin closures may need to stay in place for 2 weeks or longer. If adhesive strip edges start to loosen and curl up, you may trim the loose edges. Do not remove adhesive strips completely unless your health care provider tells you to do that.  Check your insertion site every day for signs of infection. Check for: ? Redness, swelling, or pain. ? Fluid or blood. ? Pus or a bad smell. ? Warmth.  Do not take baths, swim, or use a hot tub until your health care provider approves.  You may shower 24-48 hours after the procedure, or as directed by your health care provider. ? Remove the dressing and gently wash the site with plain soap and water. ? Pat the area dry with a clean towel. ? Do not rub the site. That could cause bleeding.  Do not apply powder or lotion to the site. Activity   For 24 hours after the procedure, or as directed by your health care provider: ? Do not flex or bend the affected arm. ? Do not push or pull heavy objects with the affected arm. ? Do not  drive yourself home from the hospital or clinic. You may drive 24 hours after the procedure unless your health care provider tells you not to. ? Do not operate machinery or power tools.  Do not lift anything that is heavier than 10 lb (4.5 kg), or the limit that you are told, until your health care provider says that it is safe.  Ask your health care provider when it is okay to: ? Return to work or school. ? Resume usual physical activities or sports. ? Resume sexual activity. General instructions  If the catheter site starts to bleed, raise your arm and put firm pressure on the site. If the bleeding does not stop, get help right away. This is a medical emergency.  If you went home on the same day as your procedure, a responsible adult should be with you for the first 24 hours after you arrive home.  Keep all follow-up visits as told by your health care provider. This is important. Contact a health care provider if:  You have a fever.  You have redness, swelling, or yellow drainage around your insertion site. Get help right away if:  You have unusual pain at the radial site.  The catheter insertion area swells very fast.  The insertion area is bleeding, and the bleeding does not stop when you hold steady pressure on the area.  Your arm or hand becomes pale, cool, tingly, or numb. These symptoms may represent a serious problem   that is an emergency. Do not wait to see if the symptoms will go away. Get medical help right away. Call your local emergency services (911 in the [place] Do not drive yourself to the hospital. Summary  After the procedure, it is common to have bruising and tenderness at the site.  Follow instructions from your health care provider about how to take care of your radial site wound. Check the wound every day for signs of infection.  Do not lift anything that is heavier than 10 lb (4.5 kg), or the limit that you are told, until your health care provider says  that it is safe. This information is not intended to replace advice given to you by your health care provider. Make sure you discuss any questions you have with your health care provider. Document Revised: 01/12/2018 Document Reviewed: 01/12/2018 Elsevier Patient Education  2020 [employer]  

## 2020-01-12 MED FILL — Heparin Sodium (Porcine) Inj 1000 Unit/ML: INTRAMUSCULAR | Qty: 10 | Status: AC

## 2020-02-05 ENCOUNTER — Ambulatory Visit: Payer: Medicaid Other | Admitting: Cardiology

## 2020-02-16 ENCOUNTER — Other Ambulatory Visit: Payer: Self-pay

## 2020-02-16 ENCOUNTER — Encounter: Payer: Self-pay | Admitting: Cardiology

## 2020-02-16 ENCOUNTER — Ambulatory Visit (INDEPENDENT_AMBULATORY_CARE_PROVIDER_SITE_OTHER): Payer: Medicaid Other | Admitting: Cardiology

## 2020-02-16 VITALS — BP 130/82 | HR 82 | Ht 62.5 in | Wt 171.0 lb

## 2020-02-16 DIAGNOSIS — I10 Essential (primary) hypertension: Secondary | ICD-10-CM | POA: Diagnosis not present

## 2020-02-16 DIAGNOSIS — I25119 Atherosclerotic heart disease of native coronary artery with unspecified angina pectoris: Secondary | ICD-10-CM

## 2020-02-16 DIAGNOSIS — E782 Mixed hyperlipidemia: Secondary | ICD-10-CM

## 2020-02-16 DIAGNOSIS — E669 Obesity, unspecified: Secondary | ICD-10-CM

## 2020-02-16 DIAGNOSIS — E108 Type 1 diabetes mellitus with unspecified complications: Secondary | ICD-10-CM | POA: Diagnosis not present

## 2020-02-16 HISTORY — DX: Obesity, unspecified: E66.9

## 2020-02-16 MED ORDER — RANOLAZINE ER 500 MG PO TB12: 500.0000 mg | ORAL_TABLET | Freq: Two times a day (BID) | ORAL | 1 refills | Status: DC

## 2020-02-16 MED ORDER — ATORVASTATIN CALCIUM 40 MG PO TABS: 40.0000 mg | ORAL_TABLET | Freq: Every day | ORAL | 1 refills | Status: DC

## 2020-02-16 MED ORDER — CARVEDILOL 3.125 MG PO TABS: 3.1250 mg | ORAL_TABLET | Freq: Two times a day (BID) | ORAL | 1 refills | Status: AC

## 2020-02-16 NOTE — Patient Instructions (Addendum)
Medication Instructions:  Your physician recommends that you continue on your current medications as directed. Please refer to the Current Medication list given to you today.  *If you need a refill on your cardiac medications before your next appointment, please call your pharmacy*   Lab Work: None If you have labs (blood work) drawn today and your tests are completely normal, you will receive your results only by: MyChart Message (if you have MyChart) OR A paper copy in the mail If you have any lab test that is abnormal or we need to change your treatment, we will call you to review the results.   Testing/Procedures: NOne   Follow-Up: At [facility], you and your health needs are our priority.  As part of our continuing mission to provide you with exceptional heart care, we have created designated Provider Care Teams.  These Care Teams include your primary Cardiologist (physician) and Advanced Practice Providers (APPs -  Physician Assistants and Nurse Practitioners) who all work together to provide you with the care you need, when you need it.  We recommend signing up for the patient portal called "MyChart".  Sign up information is provided on this After Visit Summary.  MyChart is used to connect with patients for Virtual Visits (Telemedicine).  Patients are able to view lab/test results, encounter notes, upcoming appointments, etc.  Non-urgent messages can be sent to your provider as well.   To learn more about what you can do with MyChart, go to [email or web address].    Your next appointment:   6 month(s)  The format for your next appointment:   In Person  Provider:   Kardie Tobb, DO   Other Instructions   

## 2020-02-16 NOTE — Progress Notes (Signed)
Cardiology Office Note:    Date:  02/16/2020   ID:  Kelsey Gillespie, DOB 05-15-1961, MRN [id number]  PCP:  Gordy Councilman, FNP  Cardiologist:  Thomasene Ripple, DO  Electrophysiologist:  None   Referring MD: Gordy Councilman, FNP   Chief Complaint  Patient presents with  . Follow-up    Follow up for coronary artery disease.  History of Present Illness:    Kelsey Gillespie is a 59 y.o. female with a hx of coronary disease, status PCI in 2013, hypertension, hyperlipidemia, initially presented on 2020-01-04 to be evaluated for chest pain.  Given the severity of the pain and the patient risk factors I recommended she undergo a left heart catheterization.  At that time I also started the patient on Ranexa 500 mg twice a day.    In the interim the patient was able to undergo left heart catheterization which showed 50% ostial RCA lesion, proximal to mid 45% gnosis, 45% left circumflex proximal in-stent stenosis, and 40% LAD mid lesion.  No indication for PCI and no obvious culprit for angina.  With this we will continue patient on Ranexa.  She is here for follow-up visit.  She tells me that her symptoms have tremendously improved on the Ranexa.  She is happy with the way she feels at this time. No other complaints at this time.   Past Medical History:  Diagnosis Date  . Angina pectoris (HCC) 10/04/2015  . Chest pain 10/04/2015  . Coronary artery disease involving native coronary artery with angina pectoris (HCC) 10/01/2015   PCI for acute RCA occlusion 03/09/12 EF 55% Cardiac cath 10/04/15:Conclusions Diagnostic Procedure Summary 1. Nonobstructive CAD. Mild restenosis of RCA and LCx. 2. Low-normal LV systolic function, EF 50% 3. Inferior hypokinesis. Diagnostic Procedure Recommendations Consider noncardiac causes of the patient's symptoms. Continue medical therapy for nonobstructive CAD. Signatures  . Crohn disease (HCC)   . Diabetes (HCC)   . Essential hypertension 10/01/2015  . Heart attack (HCC)  02/2012  . Hyperlipidemia 10/01/2015    Past Surgical History:  Procedure Laterality Date  . CARDIAC CATHETERIZATION     1 stent placed  . CESAREAN SECTION    . CHOLECYSTECTOMY    . GASTRIC BYPASS    . LEFT HEART CATH AND CORONARY ANGIOGRAPHY N/A 01/11/2020   Procedure: LEFT HEART CATH AND CORONARY ANGIOGRAPHY;  Surgeon: Marykay Lex, MD;  Location[facility] INVASIVE CV LAB;  Service: Cardiovascular;  Laterality: N/A;  . PARTIAL HYSTERECTOMY      Current Medications: Current Meds  Medication Sig  . acetaminophen (TYLENOL) 500 MG tablet Take 1,000 mg by mouth every 8 (eight) hours as needed for moderate pain or headache.  . albuterol (VENTOLIN HFA) 108 (90 Base) MCG/ACT inhaler Inhale 2 puffs into the lungs every 6 (six) hours as needed for wheezing or shortness of breath.  . ALPRAZolam (XANAX) 1 MG tablet Take 1 mg by mouth daily as needed for anxiety.   Marland Kitchen atorvastatin (LIPITOR) 40 MG tablet Take 1 tablet (40 mg total) by mouth at bedtime.  . carvedilol (COREG) 3.125 MG tablet Take 1 tablet (3.125 mg total) by mouth 2 (two) times daily with a meal.  . Exenatide ER (BYDUREON) 2 MG PEN Inject 2 mg into the skin every Sunday.   Marland Kitchen FIBER ADULT GUMMIES PO Take 2 capsules by mouth daily.  . fluticasone (FLONASE) 50 MCG/ACT nasal spray Place 1 spray into both nostrils daily as needed for allergies or rhinitis.  Marland Kitchen gabapentin (NEURONTIN) 300 MG capsule  Take 300 mg by mouth 3 (three) times daily.  . insulin detemir (LEVEMIR) 100 UNIT/ML injection Inject 32 Units into the skin at bedtime.   Marland Kitchen lisinopril (ZESTRIL) 5 MG tablet Take 1 tablet (5 mg total) by mouth daily.  . metFORMIN (GLUCOPHAGE) 500 MG tablet Take 1,000 mg by mouth 2 (two) times daily.  . nitroGLYCERIN (NITROSTAT) 0.4 MG SL tablet Place 0.4 mg under the tongue every 5 (five) minutes as needed for chest pain.  Marland Kitchen omeprazole (PRILOSEC) 40 MG capsule Take 40 mg by mouth 2 (two) times daily.  . ranolazine (RANEXA) 500 MG 12 hr tablet Take  1 tablet (500 mg total) by mouth 2 (two) times daily.  . [DISCONTINUED] atorvastatin (LIPITOR) 40 MG tablet Take 40 mg by mouth at bedtime.  . [DISCONTINUED] carvedilol (COREG) 3.125 MG tablet Take 3.125 mg by mouth 2 (two) times daily.   . [DISCONTINUED] ranolazine (RANEXA) 500 MG 12 hr tablet Take 1 tablet (500 mg total) by mouth 2 (two) times daily.     Allergies:   Codeine and Lidocaine   Social History   Socioeconomic History  . Marital status: Married    Spouse name: Not on file  . Number of children: Not on file  . Years of education: Not on file  . Highest education level: Not on file  Occupational History  . Not on file  Tobacco Use  . Smoking status: Former Smoker    Types: Cigarettes    Quit date: 01/03/1995    Years since quitting: 25.1  . Smokeless tobacco: Never Used  Substance and Sexual Activity  . Alcohol use: Never  . Drug use: Never  . Sexual activity: Not on file  Other Topics Concern  . Not on file  Social History Narrative  . Not on file   Social Determinants of Health   Financial Resource Strain:   . Difficulty of Paying Living Expenses: Not on file  Food Insecurity:   . Worried About Charity fundraiser in the Last Year: Not on file  . Ran Out of Food in the Last Year: Not on file  Transportation Needs:   . Lack of Transportation (Medical): Not on file  . Lack of Transportation (Non-Medical): Not on file  Physical Activity:   . Days of Exercise per Week: Not on file  . Minutes of Exercise per Session: Not on file  Stress:   . Feeling of Stress : Not on file  Social Connections:   . Frequency of Communication with Friends and Family: Not on file  . Frequency of Social Gatherings with Friends and Family: Not on file  . Attends Religious Services: Not on file  . Active Member of Clubs or Organizations: Not on file  . Attends Archivist Meetings: Not on file  . Marital Status: Not on file     Family History: The patient's family  history includes Atrial fibrillation in her brother; Diabetes in her maternal grandfather and mother; Heart attack in her father and maternal grandmother; Heart disease in her brother; Stroke in her mother.  ROS:   Review of Systems  Constitution: Negative for decreased appetite, fever and weight gain.  HENT: Negative for congestion, ear discharge, hoarse voice and sore throat.   Eyes: Negative for discharge, redness, vision loss in right eye and visual halos.  Cardiovascular: Negative for chest pain, dyspnea on exertion, leg swelling, orthopnea and palpitations.  Respiratory: Negative for cough, hemoptysis, shortness of breath and snoring.   Endocrine:  Negative for heat intolerance and polyphagia.  Hematologic/Lymphatic: Negative for bleeding problem. Does not bruise/bleed easily.  Skin: Negative for flushing, nail changes, rash and suspicious lesions.  Musculoskeletal: Negative for arthritis, joint pain, muscle cramps, myalgias, neck pain and stiffness.  Gastrointestinal: Negative for abdominal pain, bowel incontinence, diarrhea and excessive appetite.  Genitourinary: Negative for decreased libido, genital sores and incomplete emptying.  Neurological: Negative for brief paralysis, focal weakness, headaches and loss of balance.  Psychiatric/Behavioral: Negative for altered mental status, depression and suicidal ideas.  Allergic/Immunologic: Negative for HIV exposure and persistent infections.    EKGs/Labs/Other Studies Reviewed:    The following studies were reviewed today:   EKG:  The ekg ordered today demonstrates   Recent Labs: 01/04/2020: BUN 9; Creatinine, Ser 0.62; Hemoglobin 11.9; Platelets 291; Potassium 4.3; Sodium 142  Recent Lipid Panel No results found for: CHOL, TRIG, HDL, CHOLHDL, VLDL, LDLCALC, LDLDIRECT  Physical Exam:    VS:  BP 130/82 (BP Location: Left Arm, Patient Position: Sitting, Cuff Size: Normal)   Pulse 82   Ht 5' 2.5" (1.588 m)   Wt 171 lb (77.6 kg)    SpO2 98%   BMI 30.78 kg/m     Wt Readings from Last 3 Encounters:  02/16/20 171 lb (77.6 kg)  01/11/20 172 lb (78 kg)  01/04/20 179 lb (81.2 kg)     GEN: Well nourished, well developed in no acute distress HEENT: Normal NECK: No JVD; No carotid bruits LYMPHATICS: No lymphadenopathy CARDIAC: S1S2 noted,RRR, no murmurs, rubs, gallops RESPIRATORY:  Clear to auscultation without rales, wheezing or rhonchi  ABDOMEN: Soft, non-tender, non-distended, +bowel sounds, no guarding. EXTREMITIES: No edema, No cyanosis, no clubbing MUSCULOSKELETAL:  No deformity  SKIN: Warm and dry NEUROLOGIC:  Alert and oriented x 3, non-focal PSYCHIATRIC:  Normal affect, good insight  ASSESSMENT:    1. Essential hypertension   2. Coronary artery disease involving native coronary artery of native heart with angina pectoris (HCC)   3. Type 1 diabetes mellitus with complications (HCC)   4. Mixed hyperlipidemia   5. Obesity (BMI 30-39.9)    PLAN:     1.  On the Ranexa the patient angina symptoms had significant improved.  Magana continue this medication for now.  We will continue patient on her aspirin, as well as her Lipitor 40 in the setting of her coronary artery disease.  2.  Her blood pressure has significantly improved we will continue patient on her current medication regimen which includes carvedilol 3.125 mg twice a day, lisinopril 5 mg daily.  3.  Diabetes management per primary doctor.  4.  I did congratulate the patient as she has lost at least 8 pounds since her last visit.  She will continue to exercise and improve her diet.  The patient is in agreement with the above plan. The patient left the office in stable condition.  The patient will follow up in 6 months or sooner if needed.   Medication Adjustments/Labs and Tests Ordered: Current medicines are reviewed at length with the patient today.  Concerns regarding medicines are outlined above.  No orders of the defined types were placed in  this encounter.  Meds ordered this encounter  Medications  . atorvastatin (LIPITOR) 40 MG tablet    Sig: Take 1 tablet (40 mg total) by mouth at bedtime.    Dispense:  90 tablet    Refill:  1  . carvedilol (COREG) 3.125 MG tablet    Sig: Take 1 tablet (3.125 mg total) by  mouth 2 (two) times daily with a meal.    Dispense:  180 tablet    Refill:  1  . ranolazine (RANEXA) 500 MG 12 hr tablet    Sig: Take 1 tablet (500 mg total) by mouth 2 (two) times daily.    Dispense:  180 tablet    Refill:  1    Patient Instructions  Medication Instructions:  Your physician recommends that you continue on your current medications as directed. Please refer to the Current Medication list given to you today.  *If you need a refill on your cardiac medications before your next appointment, please call your pharmacy*   Lab Work: None If you have labs (blood work) drawn today and your tests are completely normal, you will receive your results only by: Marland Kitchen MyChart Message (if you have MyChart) OR . A paper copy in the mail If you have any lab test that is abnormal or we need to change your treatment, we will call you to review the results.   Testing/Procedures: NOne   Follow-Up: At [facility], you and your health needs are our priority.  As part of our continuing mission to provide you with exceptional heart care, we have created designated Provider Care Teams.  These Care Teams include your primary Cardiologist (physician) and Advanced Practice Providers (APPs -  Physician Assistants and Nurse Practitioners) who all work together to provide you with the care you need, when you need it.  We recommend signing up for the patient portal called "MyChart".  Sign up information is provided on this After Visit Summary.  MyChart is used to connect with patients for Virtual Visits (Telemedicine).  Patients are able to view lab/test results, encounter notes, upcoming appointments, etc.  Non-urgent messages  can be sent to your provider as well.   To learn more about what you can do with MyChart, go to [email or web address].    Your next appointment:   6 month(s)  The format for your next appointment:   In Person  Provider:   Thomasene Ripple, DO   Other Instructions       Adopting a Healthy Lifestyle.  Know what a healthy weight is for you (roughly BMI <25) and aim to maintain this   Aim for 7+ servings of fruits and vegetables daily   65-80+ fluid ounces of water or unsweet tea for healthy kidneys   Limit to max 1 drink of alcohol per day; avoid smoking/tobacco   Limit animal fats in diet for cholesterol and heart health - choose grass fed whenever available   Avoid highly processed foods, and foods high in saturated/trans fats   Aim for low stress - take time to unwind and care for your mental health   Aim for 150 min of moderate intensity exercise weekly for heart health, and weights twice weekly for bone health   Aim for 7-9 hours of sleep daily   When it comes to diets, agreement about the perfect plan isnt easy to find, even among the experts. Experts at the [facility] [employer] developed an idea known as the Healthy Eating Plate. Just imagine a plate divided into logical, healthy portions.   The emphasis is on diet quality:   Load up on vegetables and fruits - one-half of your plate: Aim for color and variety, and remember that potatoes dont count.   Go for whole grains - one-quarter of your plate: Whole wheat, barley, wheat berries, quinoa, oats, Macha rice, and foods made with them.  If you want pasta, go with whole wheat pasta.   Protein power - one-quarter of your plate: Fish, chicken, beans, and nuts are all healthy, versatile protein sources. Limit red meat.   The diet, however, does go beyond the plate, offering a few other suggestions.   Use healthy plant oils, such as olive, canola, soy, corn, sunflower and peanut. Check the labels, and avoid  partially hydrogenated oil, which have unhealthy trans fats.   If youre thirsty, drink water. Coffee and tea are good in moderation, but skip sugary drinks and limit milk and dairy products to one or two daily servings.   The type of carbohydrate in the diet is more important than the amount. Some sources of carbohydrates, such as vegetables, fruits, whole grains, and beans-are healthier than others.   Finally, stay active  Signed, Thomasene Ripple, DO  02/16/2020 10:08 AM    [facility]

## 2020-02-21 ENCOUNTER — Ambulatory Visit (INDEPENDENT_AMBULATORY_CARE_PROVIDER_SITE_OTHER): Payer: Medicaid Other

## 2020-02-21 ENCOUNTER — Other Ambulatory Visit: Payer: Self-pay

## 2020-02-21 DIAGNOSIS — R0602 Shortness of breath: Secondary | ICD-10-CM

## 2020-02-21 DIAGNOSIS — I25119 Atherosclerotic heart disease of native coronary artery with unspecified angina pectoris: Secondary | ICD-10-CM | POA: Diagnosis not present

## 2020-02-21 DIAGNOSIS — I10 Essential (primary) hypertension: Secondary | ICD-10-CM

## 2020-02-21 NOTE — Progress Notes (Unsigned)
Complete echocardiogram has been performed.  Jimmy Aqueelah Cotrell RDCS, RVT 

## 2020-02-22 ENCOUNTER — Telehealth: Payer: Self-pay | Admitting: Cardiology

## 2020-02-22 NOTE — Telephone Encounter (Signed)
Kelsey Gillespie is returning phone call regarding results.

## 2020-02-22 NOTE — Telephone Encounter (Signed)
Pt advised her Echo results.  

## 2020-03-05 ENCOUNTER — Encounter: Payer: Self-pay | Admitting: Cardiology

## 2020-03-05 ENCOUNTER — Ambulatory Visit (INDEPENDENT_AMBULATORY_CARE_PROVIDER_SITE_OTHER): Payer: Medicaid Other | Admitting: Cardiology

## 2020-03-05 ENCOUNTER — Other Ambulatory Visit: Payer: Self-pay

## 2020-03-05 VITALS — BP 160/82 | HR 81 | Ht 62.5 in | Wt 170.0 lb

## 2020-03-05 DIAGNOSIS — I10 Essential (primary) hypertension: Secondary | ICD-10-CM | POA: Diagnosis not present

## 2020-03-05 DIAGNOSIS — E782 Mixed hyperlipidemia: Secondary | ICD-10-CM

## 2020-03-05 DIAGNOSIS — E669 Obesity, unspecified: Secondary | ICD-10-CM | POA: Diagnosis not present

## 2020-03-05 DIAGNOSIS — I25119 Atherosclerotic heart disease of native coronary artery with unspecified angina pectoris: Secondary | ICD-10-CM | POA: Diagnosis not present

## 2020-03-05 MED ORDER — LISINOPRIL 10 MG PO TABS: 10.0000 mg | ORAL_TABLET | Freq: Every day | ORAL | 1 refills | Status: DC

## 2020-03-05 NOTE — Progress Notes (Signed)
Cardiology Office Note:    Date:  03/05/2020   ID:  [name], DOB 1961/06/27, MRN [id number]  PCP:  Penelope Coop, FNP  Cardiologist:  Berniece Salines, DO  Electrophysiologist:  None   Referring MD: Penelope Coop, FNP   Chief Complaint  Patient presents with  . Follow-up   History of Present Illness:    Kelsey Gillespie is a 59 y.o. female with a hx of coronary disease, status PCI in 2013, hypertension, hyperlipidemia, initially presented on 2020-01-04 to be evaluated for chest pain.  Given the severity of the pain and the patient risk factors I recommended she undergo a left heart catheterization.  At that time I also started the patient on Ranexa 500 mg twice a day.    In the interim the patient was able to undergo left heart catheterization which showed 50% ostial RCA lesion, proximal to mid 45% gnosis, 45% left circumflex proximal in-stent stenosis, and 40% LAD mid lesion.  No indication for PCI and no obvious culprit for angina.  I did see the patient in february and she was doing well, therefore we extended her follow up to six months. Today she is here because her blood pressure has been elevated. She denies any chest pain.  Past Medical History:  Diagnosis Date  . Angina pectoris ([place]) 10/04/2015  . Chest pain 10/04/2015  . Coronary artery disease involving native coronary artery with angina pectoris ([place]) 10/01/2015   PCI for acute RCA occlusion 03/09/12 EF 55% Cardiac cath 10/04/15:Conclusions Diagnostic Procedure Summary 1. Nonobstructive CAD. Mild restenosis of RCA and LCx. 2. Low-normal LV systolic function, EF 01% 3. Inferior hypokinesis. Diagnostic Procedure Recommendations Consider noncardiac causes of the patient's symptoms. Continue medical therapy for nonobstructive CAD. Signatures  . Crohn disease ([place])   . Diabetes ([place])   . Essential hypertension 10/01/2015  . Heart attack ([place]) 02/2012  . Hyperlipidemia 10/01/2015  . Obesity (BMI 30-39.9) 02/16/2020  .  Progressive angina ([place]) 10/04/2015  . Shortness of breath 01/04/2020  . 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 CARDIAC CATHETERIZATION     1 stent placed  . CESAREAN SECTION    . CHOLECYSTECTOMY    . GASTRIC BYPASS    . LEFT HEART CATH AND CORONARY ANGIOGRAPHY N/A 01/11/2020   Procedure: LEFT HEART CATH AND CORONARY ANGIOGRAPHY;  Surgeon: Leonie Man, MD;  Location: [facility];  Service: Cardiovascular;  Laterality: N/A;  . PARTIAL HYSTERECTOMY      Current Medications: Current Meds  Medication Sig  . acetaminophen (TYLENOL) 500 MG tablet Take 1,000 mg by mouth every 8 (eight) hours as needed for moderate pain or headache.  . albuterol (VENTOLIN HFA) 108 (90 Base) MCG/ACT inhaler Inhale 2 puffs into the lungs every 6 (six) hours as needed for wheezing or shortness of breath.  . ALPRAZolam (XANAX) 1 MG tablet Take 1 mg by mouth daily as needed for anxiety.   Marland Kitchen atorvastatin (LIPITOR) 40 MG tablet Take 1 tablet (40 mg total) by mouth at bedtime.  . carvedilol (COREG) 3.125 MG tablet Take 1 tablet (3.125 mg total) by mouth 2 (two) times daily with a meal.  . Exenatide ER (BYDUREON) 2 MG PEN Inject 2 mg into the skin every Sunday.   Marland Kitchen FIBER ADULT GUMMIES PO Take 2 capsules by mouth daily.  . fluticasone (FLONASE) 50 MCG/ACT nasal spray Place 1 spray into both nostrils daily as needed for  allergies or rhinitis.  Marland Kitchen gabapentin (NEURONTIN) 300 MG capsule Take 300 mg by mouth 3 (three) times daily.  . insulin detemir (LEVEMIR) 100 UNIT/ML injection Inject 32 Units into the skin at bedtime.   Marland Kitchen lisinopril (ZESTRIL) 10 MG tablet Take 1 tablet (10 mg total) by mouth daily.  . metFORMIN (GLUCOPHAGE) 500 MG tablet Take 1,000 mg by mouth 2 (two) times daily.  . nitroGLYCERIN (NITROSTAT) 0.4 MG SL tablet Place 0.4 mg under the tongue every 5 (five) minutes as needed for chest pain.  Marland Kitchen omeprazole (PRILOSEC) 40 MG  capsule Take 40 mg by mouth 2 (two) times daily.  . ranolazine (RANEXA) 500 MG 12 hr tablet Take 1 tablet (500 mg total) by mouth 2 (two) times daily.  . [DISCONTINUED] lisinopril (ZESTRIL) 5 MG tablet Take 1 tablet (5 mg total) by mouth daily.     Allergies:   Codeine and Lidocaine   Social History   Socioeconomic History  . Marital status: Married    Spouse name: Not on file  . Number of children: Not on file  . Years of education: Not on file  . Highest education level: Not on file  Occupational History  . Not on file  Tobacco Use  . Smoking status: Former Smoker    Types: Cigarettes    Quit date: 01/03/1995    Years since quitting: 25.1  . Smokeless tobacco: Never Used  Substance and Sexual Activity  . Alcohol use: Never  . Drug use: Never  . Sexual activity: Not on file  Other Topics Concern  . Not on file  Social History Narrative  . Not on file   Social Determinants of Health   Financial Resource Strain:   . Difficulty of Paying Living Expenses:   Food Insecurity:   . Worried About Programme researcher, broadcasting/film/video in the Last Year:   . Barista in the Last Year:   Transportation Needs:   . Freight forwarder (Medical):   Marland Kitchen Lack of Transportation (Non-Medical):   Physical Activity:   . Days of Exercise per Week:   . Minutes of Exercise per Session:   Stress:   . Feeling of Stress :   Social Connections:   . Frequency of Communication with Friends and Family:   . Frequency of Social Gatherings with Friends and Family:   . Attends Religious Services:   . Active Member of Clubs or Organizations:   . Attends Banker Meetings:   Marland Kitchen Marital Status:      Family History: The patient's family history includes Atrial fibrillation in her brother; Diabetes in her maternal grandfather and mother; Heart attack in her father and maternal grandmother; Heart disease in her brother; Stroke in her mother.  ROS:   Review of Systems  Constitution: Negative for  decreased appetite, fever and weight gain.  HENT: Negative for congestion, ear discharge, hoarse voice and sore throat.   Eyes: Negative for discharge, redness, vision loss in right eye and visual halos.  Cardiovascular: Negative for chest pain, dyspnea on exertion, leg swelling, orthopnea and palpitations.  Respiratory: Negative for cough, hemoptysis, shortness of breath and snoring.   Endocrine: Negative for heat intolerance and polyphagia.  Hematologic/Lymphatic: Negative for bleeding problem. Does not bruise/bleed easily.  Skin: Negative for flushing, nail changes, rash and suspicious lesions.  Musculoskeletal: Negative for arthritis, joint pain, muscle cramps, myalgias, neck pain and stiffness.  Gastrointestinal: Negative for abdominal pain, bowel incontinence, diarrhea and excessive appetite.  Genitourinary: Negative  for decreased libido, genital sores and incomplete emptying.  Neurological: Negative for brief paralysis, focal weakness, headaches and loss of balance.  Psychiatric/Behavioral: Negative for altered mental status, depression and suicidal ideas.  Allergic/Immunologic: Negative for HIV exposure and persistent infections.    EKGs/Labs/Other Studies Reviewed:    The following studies were reviewed today:   EKG:  None today  Recent Labs: 01/04/2020: BUN 9; Creatinine, Ser 0.62; Hemoglobin 11.9; Platelets 291; Potassium 4.3; Sodium 142  Recent Lipid Panel No results found for: CHOL, TRIG, HDL, CHOLHDL, VLDL, LDLCALC, LDLDIRECT  Physical Exam:    VS:  BP (!) 160/82 (BP Location: Right Arm, Patient Position: Sitting, Cuff Size: Normal)   Pulse 81   Ht 5' 2.5" (1.588 m)   Wt 170 lb (77.1 kg)   SpO2 99%   BMI 30.60 kg/m     Wt Readings from Last 3 Encounters:  03/05/20 170 lb (77.1 kg)  02/16/20 171 lb (77.6 kg)  01/11/20 172 lb (78 kg)     GEN: Well nourished, well developed in no acute distress HEENT: Normal NECK: No JVD; No carotid bruits LYMPHATICS: No  lymphadenopathy CARDIAC: S1S2 noted,RRR, no murmurs, rubs, gallops RESPIRATORY:  Clear to auscultation without rales, wheezing or rhonchi  ABDOMEN: Soft, non-tender, non-distended, +bowel sounds, no guarding. EXTREMITIES: No edema, No cyanosis, no clubbing MUSCULOSKELETAL:  No deformity  SKIN: Warm and dry NEUROLOGIC:  Alert and oriented x 3, non-focal PSYCHIATRIC:  Normal affect, good insight  ASSESSMENT:    No diagnosis found. PLAN:     1.Her blood pressure manually is 160/82 mmHG today in the office- I will increase her lisinopril to 10 mg daily. She will take her blood pressure daily, and bring this to her next appointment.  2. CAD - chest pain is improved. Recent LHC no indication for PCI. Continue her current medication regimen.   3. HLN continue lipitor 40mg  daily.  The patient is in agreement with the above plan. The patient left the office in stable condition.  The patient will follow up in 1 month for blood pressure check.    Medication Adjustments/Labs and Tests Ordered: Current medicines are reviewed at length with the patient today.  Concerns regarding medicines are outlined above.  No orders of the defined types were placed in this encounter.  Meds ordered this encounter  Medications  . lisinopril (ZESTRIL) 10 MG tablet    Sig: Take 1 tablet (10 mg total) by mouth daily.    Dispense:  90 tablet    Refill:  1    Patient Instructions  Medication Instructions:  Your physician has recommended you make the following change in your medication:   INCREASE: Lisinopril to 10 mg daily  *If you need a refill on your cardiac medications before your next appointment, please call your pharmacy*   Lab Work: None.  If you have labs (blood work) drawn today and your tests are completely normal, you will receive your results only by: MyChart Message (if you have MyChart) OR . A paper copy in the mail If you have any lab test that is abnormal or we need to change your  treatment, we will call you to review the results.   Testing/Procedures: None.    Follow-Up: At [facility], you and your health needs are our priority.  As part of our continuing mission to provide you with exceptional heart care, we have created designated Provider Care Teams.  These Care Teams include your primary Cardiologist (physician) and Advanced Practice Providers (  APPs -  Physician Assistants and Nurse Practitioners) who all work together to provide you with the care you need, when you need it.  We recommend signing up for the patient portal called "MyChart".  Sign up information is provided on this After Visit Summary.  MyChart is used to connect with patients for Virtual Visits (Telemedicine).  Patients are able to view lab/test results, encounter notes, upcoming appointments, etc.  Non-urgent messages can be sent to your provider as well.   To learn more about what you can do with MyChart, go to [email or web address].    Your next appointment:   3 month(s)  The format for your next appointment:   In Person  Provider:   Thomasene Ripple, DO   Other Instructions       Adopting a Healthy Lifestyle.  Know what a healthy weight is for you (roughly BMI <25) and aim to maintain this   Aim for 7+ servings of fruits and vegetables daily   65-80+ fluid ounces of water or unsweet tea for healthy kidneys   Limit to max 1 drink of alcohol per day; avoid smoking/tobacco   Limit animal fats in diet for cholesterol and heart health - choose grass fed whenever available   Avoid highly processed foods, and foods high in saturated/trans fats   Aim for low stress - take time to unwind and care for your mental health   Aim for 150 min of moderate intensity exercise weekly for heart health, and weights twice weekly for bone health   Aim for 7-9 hours of sleep daily   When it comes to diets, agreement about the perfect plan isnt easy to find, even among the experts. Experts  at the [facility] [employer] developed an idea known as the Healthy Eating Plate. Just imagine a plate divided into logical, healthy portions.   The emphasis is on diet quality:   Load up on vegetables and fruits - one-half of your plate: Aim for color and variety, and remember that potatoes dont count.   Go for whole grains - one-quarter of your plate: Whole wheat, barley, wheat berries, quinoa, oats, Reynolds rice, and foods made with them. If you want pasta, go with whole wheat pasta.   Protein power - one-quarter of your plate: Fish, chicken, beans, and nuts are all healthy, versatile protein sources. Limit red meat.   The diet, however, does go beyond the plate, offering a few other suggestions.   Use healthy plant oils, such as olive, canola, soy, corn, sunflower and peanut. Check the labels, and avoid partially hydrogenated oil, which have unhealthy trans fats.   If youre thirsty, drink water. Coffee and tea are good in moderation, but skip sugary drinks and limit milk and dairy products to one or two daily servings.   The type of carbohydrate in the diet is more important than the amount. Some sources of carbohydrates, such as vegetables, fruits, whole grains, and beans-are healthier than others.   Finally, stay active  Signed, Thomasene Ripple, DO  03/05/2020 10:36 AM    [facility]

## 2020-03-05 NOTE — Patient Instructions (Signed)
Medication Instructions:  Your physician has recommended you make the following change in your medication:   INCREASE: Lisinopril to 10 mg daily  *If you need a refill on your cardiac medications before your next appointment, please call your pharmacy*   Lab Work: None.  If you have labs (blood work) drawn today and your tests are completely normal, you will receive your results only by: Marland Kitchen MyChart Message (if you have MyChart) OR . A paper copy in the mail If you have any lab test that is abnormal or we need to change your treatment, we will call you to review the results.   Testing/Procedures: None.    Follow-Up: At [facility], you and your health needs are our priority.  As part of our continuing mission to provide you with exceptional heart care, we have created designated Provider Care Teams.  These Care Teams include your primary Cardiologist (physician) and Advanced Practice Providers (APPs -  Physician Assistants and Nurse Practitioners) who all work together to provide you with the care you need, when you need it.  We recommend signing up for the patient portal called "MyChart".  Sign up information is provided on this After Visit Summary.  MyChart is used to connect with patients for Virtual Visits (Telemedicine).  Patients are able to view lab/test results, encounter notes, upcoming appointments, etc.  Non-urgent messages can be sent to your provider as well.   To learn more about what you can do with MyChart, go to [email or web address].    Your next appointment:   3 month(s)  The format for your next appointment:   In Person  Provider:   Thomasene Ripple, DO   Other Instructions

## 2020-05-30 ENCOUNTER — Ambulatory Visit: Payer: Medicaid Other | Admitting: Cardiology

## 2020-05-30 ENCOUNTER — Encounter: Payer: Self-pay | Admitting: Cardiology

## 2020-05-30 ENCOUNTER — Other Ambulatory Visit: Payer: Self-pay

## 2020-05-30 VITALS — BP 130/70 | HR 73 | Ht 62.5 in | Wt 162.4 lb

## 2020-05-30 DIAGNOSIS — I25119 Atherosclerotic heart disease of native coronary artery with unspecified angina pectoris: Secondary | ICD-10-CM

## 2020-05-30 DIAGNOSIS — I10 Essential (primary) hypertension: Secondary | ICD-10-CM

## 2020-05-30 DIAGNOSIS — E108 Type 1 diabetes mellitus with unspecified complications: Secondary | ICD-10-CM

## 2020-05-30 DIAGNOSIS — E782 Mixed hyperlipidemia: Secondary | ICD-10-CM

## 2020-05-30 DIAGNOSIS — Z7689 Persons encountering health services in other specified circumstances: Secondary | ICD-10-CM

## 2020-05-30 NOTE — Patient Instructions (Signed)

## 2020-05-30 NOTE — Progress Notes (Signed)
Cardiology Office Note:    Date:  05/30/2020   ID:  [name], DOB [birth year], MRN [id number]  PCP:  Penelope Coop, FNP  Cardiologist:  Berniece Salines, DO  Electrophysiologist:  None   Referring MD: Penelope Coop, FNP   " I am doing well"  History of Present Illness:    Kelsey Brownis a 59 y.o.femalewith a hx ofcoronary disease, status PCI in 2013, on 01/11/2020 she underwent a LHC which showed moderate CAD with no need for PCI , hypertension, hyperlipidemia.   I saw the patient March 2016 at that time increase her lisinopril to 10 mg daily.  She is here today for follow-up visit.  She has been feeling better.  No other complaints at this time.  Past Medical History:  Diagnosis Date   Angina pectoris ([place]) 10/04/2015   Chest pain 10/04/2015   Coronary artery disease involving native coronary artery with angina pectoris ([place]) 10/01/2015   PCI for acute RCA occlusion 03/09/12 EF 55% Cardiac cath 10/04/15:Conclusions Diagnostic Procedure Summary 1. Nonobstructive CAD. Mild restenosis of RCA and LCx. 2. Low-normal LV systolic function, EF 16% 3. Inferior hypokinesis. Diagnostic Procedure Recommendations Consider noncardiac causes of the patient's symptoms. Continue medical therapy for nonobstructive CAD. Signatures   Crohn disease ([place])    Diabetes ([place])    Essential hypertension 10/01/2015   Heart attack ([place]) 02/2012   Hyperlipidemia 10/01/2015   Obesity (BMI 30-39.9) 02/16/2020   Progressive angina ([place]) 10/04/2015   Shortness of breath 01/04/2020   Type 1 diabetes mellitus with complications ([place]) 0/73/7106    Past Surgical History:  Procedure Laterality Date   CARDIAC CATHETERIZATION     1 stent placed   CESAREAN SECTION     CHOLECYSTECTOMY     GASTRIC BYPASS     LEFT HEART CATH AND CORONARY ANGIOGRAPHY N/A 01/11/2020   Procedure: LEFT HEART CATH AND CORONARY ANGIOGRAPHY;  Surgeon: Leonie Man, MD;  Location: [facility];  Service:  Cardiovascular;  Laterality: N/A;   PARTIAL HYSTERECTOMY      Current Medications: Current Meds  Medication Sig   acetaminophen (TYLENOL) 500 MG tablet Take 1,000 mg by mouth every 8 (eight) hours as needed for moderate pain or headache.   albuterol (VENTOLIN HFA) 108 (90 Base) MCG/ACT inhaler Inhale 2 puffs into the lungs every 6 (six) hours as needed for wheezing or shortness of breath.   aspirin EC 81 MG tablet Take 81 mg by mouth daily.   atorvastatin (LIPITOR) 40 MG tablet Take 1 tablet (40 mg total) by mouth at bedtime.   carvedilol (COREG) 3.125 MG tablet Take 1 tablet (3.125 mg total) by mouth 2 (two) times daily with a meal.   Exenatide ER (BYDUREON) 2 MG PEN Inject 2 mg into the skin every Sunday.    FIBER ADULT GUMMIES PO Take 2 capsules by mouth daily.   fluticasone (FLONASE) 50 MCG/ACT nasal spray Place 1 spray into both nostrils daily as needed for allergies or rhinitis.   gabapentin (NEURONTIN) 300 MG capsule Take 300 mg by mouth 3 (three) times daily.   insulin detemir (LEVEMIR) 100 UNIT/ML injection Inject 32 Units into the skin at bedtime.    lisinopril (ZESTRIL) 10 MG tablet Take 1 tablet (10 mg total) by mouth daily.   metFORMIN (GLUCOPHAGE) 500 MG tablet Take 1,000 mg by mouth 2 (two) times daily.   nitroGLYCERIN (NITROSTAT) 0.4 MG SL tablet Place 0.4 mg under the tongue every 5 (five) minutes as needed for chest pain.  omeprazole (PRILOSEC) 40 MG capsule Take 40 mg by mouth 2 (two) times daily.   ranolazine (RANEXA) 500 MG 12 hr tablet Take 1 tablet (500 mg total) by mouth 2 (two) times daily.     Allergies:   Codeine and Lidocaine   Social History   Socioeconomic History   Marital status: Married    Spouse name: Not on file   Number of children: Not on file   Years of education: Not on file   Highest education level: Not on file  Occupational History   Not on file  Tobacco Use   Smoking status: Former Smoker    Types: Cigarettes     Quit date: 01/03/1995    Years since quitting: 25.4   Smokeless tobacco: Never Used  Substance and Sexual Activity   Alcohol use: Never   Drug use: Never   Sexual activity: Not on file  Other Topics Concern   Not on file  Social History Narrative   Not on file   Social Determinants of Health   Financial Resource Strain:    Difficulty of Paying Living Expenses:   Food Insecurity:    Worried About Programme researcher, broadcasting/film/video in the Last Year:    Barista in the Last Year:   Transportation Needs:    Freight forwarder (Medical):    Lack of Transportation (Non-Medical):   Physical Activity:    Days of Exercise per Week:    Minutes of Exercise per Session:   Stress:    Feeling of Stress :   Social Connections:    Frequency of Communication with Friends and Family:    Frequency of Social Gatherings with Friends and Family:    Attends Religious Services:    Active Member of Clubs or Organizations:    Attends Engineer, structural:    Marital Status:      Family History: The patient's family history includes Atrial fibrillation in her brother; Diabetes in her maternal grandfather and mother; Heart attack in her father and maternal grandmother; Heart disease in her brother; Stroke in her mother.  ROS:   Review of Systems  Constitution: Negative for decreased appetite, fever and weight gain.  HENT: Negative for congestion, ear discharge, hoarse voice and sore throat.   Eyes: Negative for discharge, redness, vision loss in right eye and visual halos.  Cardiovascular: Negative for chest pain, dyspnea on exertion, leg swelling, orthopnea and palpitations.  Respiratory: Negative for cough, hemoptysis, shortness of breath and snoring.   Endocrine: Negative for heat intolerance and polyphagia.  Hematologic/Lymphatic: Negative for bleeding problem. Does not bruise/bleed easily.  Skin: Negative for flushing, nail changes, rash and suspicious lesions.    Musculoskeletal: Negative for arthritis, joint pain, muscle cramps, myalgias, neck pain and stiffness.  Gastrointestinal: Negative for abdominal pain, bowel incontinence, diarrhea and excessive appetite.  Genitourinary: Negative for decreased libido, genital sores and incomplete emptying.  Neurological: Negative for brief paralysis, focal weakness, headaches and loss of balance.  Psychiatric/Behavioral: Negative for altered mental status, depression and suicidal ideas.  Allergic/Immunologic: Negative for HIV exposure and persistent infections.    EKGs/Labs/Other Studies Reviewed:    The following studies were reviewed today:   EKG:  None today  LHC  01/11/2020  The left ventricular ejection fraction is low normal, 50-55% by visual estimate. Basal inferior hypokinesis  LV end diastolic pressure is moderately elevated.  Ost RCA lesion is 50% stenosed. Prox RCA to Mid RCA stent is 45% stenosed.  Prox Cx  lesion is 45% stenosed -focal in-stent restenosis  Mid LAD lesion is 40% stenosed.   SUMMARY  Diffuse mild to moderate disease with moderate in-stent restenosis in the RCA and LCx.  No obvious culprit lesion to explain angina.  Moderate severely elevated LVEDP despite having normal systolic pressures -> consider microvascular disease.  Previously noted basal to mid inferior hypokinesis noted on LV gram otherwise normal EF.   TTE IMPRESSIONS  1. Left ventricular ejection fraction, by estimation, is 60 to 65%. The  left ventricle has normal function. The left ventricle has no regional  wall motion abnormalities. Left ventricular diastolic parameters are  consistent with Grade I diastolic  dysfunction (impaired relaxation).  2. Right ventricular systolic function is normal. The right ventricular  size is normal.  3. The mitral valve is normal in structure and function. No evidence of  mitral valve regurgitation. No evidence of mitral stenosis.  4. The aortic valve is  normal in structure and function. Aortic valve  regurgitation is not visualized. No aortic stenosis is present.  5. The inferior vena cava is normal in size with greater than 50%  respiratory variability, suggesting right atrial pressure of 3 mmHg  Recent Labs: 01/04/2020: BUN 9; Creatinine, Ser 0.62; Hemoglobin 11.9; Platelets 291; Potassium 4.3; Sodium 142  Recent Lipid Panel No results found for: CHOL, TRIG, HDL, CHOLHDL, VLDL, LDLCALC, LDLDIRECT  Physical Exam:    VS:  BP 130/70    Pulse 73    Ht 5' 2.5" (1.588 m)    Wt 162 lb 6.4 oz (73.7 kg)    SpO2 98%    BMI 29.23 kg/m     Wt Readings from Last 3 Encounters:  05/30/20 162 lb 6.4 oz (73.7 kg)  03/05/20 170 lb (77.1 kg)  02/16/20 171 lb (77.6 kg)     GEN: Well nourished, well developed in no acute distress HEENT: Normal NECK: No JVD; No carotid bruits LYMPHATICS: No lymphadenopathy CARDIAC: S1S2 noted,RRR, no murmurs, rubs, gallops RESPIRATORY:  Clear to auscultation without rales, wheezing or rhonchi  ABDOMEN: Soft, non-tender, non-distended, +bowel sounds, no guarding. EXTREMITIES: No edema, No cyanosis, no clubbing MUSCULOSKELETAL:  No deformity  SKIN: Warm and dry NEUROLOGIC:  Alert and oriented x 3, non-focal PSYCHIATRIC:  Normal affect, good insight  ASSESSMENT:    1. Encounter to establish care   2. Essential hypertension   3. Coronary artery disease involving native coronary artery of native heart with angina pectoris (HCC)   4. Type 1 diabetes mellitus with complications (HCC)   5. Mixed hyperlipidemia    PLAN:     Coronary artery disease-she appears to be doing well from a cardiovascular standpoint. She believes that her renaxa has been helping her tremendously.  Therefore we will continue her current regimen.  Her blood pressure has improved significantly we will continue current medication regimen.  Hyperlipidemia continue Lipitor 40 mg daily.  Diabetes mellitus-continue patient on her current  medical regimen.  Referral for a new PCP to establish care.   The patient is in agreement with the above plan. The patient left the office in stable condition.  The patient will follow up in 6 months or sooner if needed.   Medication Adjustments/Labs and Tests Ordered: Current medicines are reviewed at length with the patient today.  Concerns regarding medicines are outlined above.  Orders Placed This Encounter  Procedures   Ambulatory referral to [facility]   No orders of the defined types were placed in this encounter.   Patient Instructions  Medication Instructions:  Your physician recommends that you continue on your current medications as directed. Please refer to the Current Medication list given to you today.  *If you need a refill on your cardiac medications before your next appointment, please call your pharmacy*   Lab Work: None.  If you have labs (blood work) drawn today and your tests are completely normal, you will receive your results only by:  MyChart Message (if you have MyChart) OR  A paper copy in the mail If you have any lab test that is abnormal or we need to change your treatment, we will call you to review the results.   Testing/Procedures: None.    Follow-Up: At [facility], you and your health needs are our priority.  As part of our continuing mission to provide you with exceptional heart care, we have created designated Provider Care Teams.  These Care Teams include your primary Cardiologist (physician) and Advanced Practice Providers (APPs -  Physician Assistants and Nurse Practitioners) who all work together to provide you with the care you need, when you need it.  We recommend signing up for the patient portal called "MyChart".  Sign up information is provided on this After Visit Summary.  MyChart is used to connect with patients for Virtual Visits (Telemedicine).  Patients are able to view lab/test results, encounter notes, upcoming  appointments, etc.  Non-urgent messages can be sent to your provider as well.   To learn more about what you can do with MyChart, go to [email or web address].    Your next appointment:   6 month(s)  The format for your next appointment:   In Person  Provider:   Thomasene Ripple, DO   Other Instructions       Adopting a Healthy Lifestyle.  Know what a healthy weight is for you (roughly BMI <25) and aim to maintain this   Aim for 7+ servings of fruits and vegetables daily   65-80+ fluid ounces of water or unsweet tea for healthy kidneys   Limit to max 1 drink of alcohol per day; avoid smoking/tobacco   Limit animal fats in diet for cholesterol and heart health - choose grass fed whenever available   Avoid highly processed foods, and foods high in saturated/trans fats   Aim for low stress - take time to unwind and care for your mental health   Aim for 150 min of moderate intensity exercise weekly for heart health, and weights twice weekly for bone health   Aim for 7-9 hours of sleep daily   When it comes to diets, agreement about the perfect plan isnt easy to find, even among the experts. Experts at the [facility] [employer] developed an idea known as the Healthy Eating Plate. Just imagine a plate divided into logical, healthy portions.   The emphasis is on diet quality:   Load up on vegetables and fruits - one-half of your plate: Aim for color and variety, and remember that potatoes dont count.   Go for whole grains - one-quarter of your plate: Whole wheat, barley, wheat berries, quinoa, oats, Gough rice, and foods made with them. If you want pasta, go with whole wheat pasta.   Protein power - one-quarter of your plate: Fish, chicken, beans, and nuts are all healthy, versatile protein sources. Limit red meat.   The diet, however, does go beyond the plate, offering a few other suggestions.   Use healthy plant oils, such as olive, canola, soy, corn, sunflower  and peanut.  Check the labels, and avoid partially hydrogenated oil, which have unhealthy trans fats.   If youre thirsty, drink water. Coffee and tea are good in moderation, but skip sugary drinks and limit milk and dairy products to one or two daily servings.   The type of carbohydrate in the diet is more important than the amount. Some sources of carbohydrates, such as vegetables, fruits, whole grains, and beans-are healthier than others.   Finally, stay active  Signed, Thomasene Ripple, DO  05/30/2020 10:23 AM    [facility]

## 2020-06-29 ENCOUNTER — Other Ambulatory Visit: Payer: Self-pay | Admitting: Cardiology

## 2020-08-29 ENCOUNTER — Other Ambulatory Visit: Payer: Self-pay | Admitting: Cardiology

## 2020-10-01 DIAGNOSIS — E119 Type 2 diabetes mellitus without complications: Secondary | ICD-10-CM

## 2020-10-01 DIAGNOSIS — E1140 Type 2 diabetes mellitus with diabetic neuropathy, unspecified: Secondary | ICD-10-CM | POA: Insufficient documentation

## 2020-10-01 DIAGNOSIS — Z794 Long term (current) use of insulin: Secondary | ICD-10-CM

## 2020-10-01 HISTORY — DX: Type 2 diabetes mellitus without complications: Z79.4

## 2020-10-01 HISTORY — DX: Type 2 diabetes mellitus with diabetic neuropathy, unspecified: E11.40

## 2020-10-01 HISTORY — DX: Type 2 diabetes mellitus without complications: E11.9

## 2020-11-27 ENCOUNTER — Other Ambulatory Visit: Payer: Self-pay

## 2020-11-27 DIAGNOSIS — K5090 Crohn's disease, unspecified, without complications: Secondary | ICD-10-CM | POA: Insufficient documentation

## 2020-11-27 DIAGNOSIS — E119 Type 2 diabetes mellitus without complications: Secondary | ICD-10-CM | POA: Insufficient documentation

## 2020-12-02 ENCOUNTER — Encounter: Payer: Self-pay | Admitting: Cardiology

## 2020-12-02 ENCOUNTER — Other Ambulatory Visit: Payer: Self-pay

## 2020-12-02 ENCOUNTER — Ambulatory Visit (INDEPENDENT_AMBULATORY_CARE_PROVIDER_SITE_OTHER): Payer: Medicaid Other | Admitting: Cardiology

## 2020-12-02 VITALS — BP 160/78 | HR 68 | Ht 62.0 in | Wt 161.4 lb

## 2020-12-02 DIAGNOSIS — I119 Hypertensive heart disease without heart failure: Secondary | ICD-10-CM

## 2020-12-02 DIAGNOSIS — E108 Type 1 diabetes mellitus with unspecified complications: Secondary | ICD-10-CM | POA: Diagnosis not present

## 2020-12-02 DIAGNOSIS — I25119 Atherosclerotic heart disease of native coronary artery with unspecified angina pectoris: Secondary | ICD-10-CM

## 2020-12-02 DIAGNOSIS — I10 Essential (primary) hypertension: Secondary | ICD-10-CM | POA: Diagnosis not present

## 2020-12-02 DIAGNOSIS — Z23 Encounter for immunization: Secondary | ICD-10-CM | POA: Diagnosis not present

## 2020-12-02 DIAGNOSIS — E782 Mixed hyperlipidemia: Secondary | ICD-10-CM

## 2020-12-02 LAB — BASIC METABOLIC PANEL
BUN/Creatinine Ratio: 19 (ref 9–23)
BUN: 13 mg/dL (ref 6–24)
CO2: 23 mmol/L (ref 20–29)
Calcium: 9.9 mg/dL (ref 8.7–10.2)
Chloride: 97 mmol/L (ref 96–106)
Creatinine, Ser: 0.69 mg/dL (ref 0.57–1.00)
GFR calc Af Amer: 111 mL/min/{1.73_m2} (ref >59)
GFR calc non Af Amer: 96 mL/min/{1.73_m2} (ref >59)
Glucose: 186 mg/dL — ABNORMAL HIGH (ref 65–99)
Potassium: 5.1 mmol/L (ref 3.5–5.2)
Sodium: 134 mmol/L (ref 134–144)

## 2020-12-02 LAB — MAGNESIUM: Magnesium: 1.6 mg/dL (ref 1.6–2.3)

## 2020-12-02 MED ORDER — LISINOPRIL 10 MG PO TABS: 10.0000 mg | ORAL_TABLET | Freq: Two times a day (BID) | ORAL | 3 refills | Status: DC

## 2020-12-02 NOTE — Patient Instructions (Signed)
Medication Instructions:  Your physician has recommended you make the following change in your medication:   Take Lisinopril 10 mg twice daily.  *If you need a refill on your cardiac medications before your next appointment, please call your pharmacy*   Lab Work: Your physician recommends that you have labs done in the office today. Your test included  basic metabolic panel and magnesium.  If you have labs (blood work) drawn today and your tests are completely normal, you will receive your results only by: Marland Kitchen MyChart Message (if you have MyChart) OR . A paper copy in the mail If you have any lab test that is abnormal or we need to change your treatment, we will call you to review the results.   Testing/Procedures: None ordered   Follow-Up: At [facility], you and your health needs are our priority.  As part of our continuing mission to provide you with exceptional heart care, we have created designated Provider Care Teams.  These Care Teams include your primary Cardiologist (physician) and Advanced Practice Providers (APPs -  Physician Assistants and Nurse Practitioners) who all work together to provide you with the care you need, when you need it.  We recommend signing up for the patient portal called "MyChart".  Sign up information is provided on this After Visit Summary.  MyChart is used to connect with patients for Virtual Visits (Telemedicine).  Patients are able to view lab/test results, encounter notes, upcoming appointments, etc.  Non-urgent messages can be sent to your provider as well.   To learn more about what you can do with MyChart, go to [email or web address].    Your next appointment:   6 week(s)  The format for your next appointment:   In Person  Provider:   Thomasene Ripple, DO   Other Instructions Lisinopril Tablets What is this medicine? LISINOPRIL (lyse IN oh pril) is an ACE inhibitor. It treats high blood pressure and heart failure. It can treat heart  damage after a heart attack. This medicine may be used for other purposes; ask your health care provider or pharmacist if you have questions. COMMON BRAND NAME(S): Prinivil, Zestril What should I tell my health care provider before I take this medicine? They need to know if you have any of these conditions:  diabetes  heart or blood vessel disease  kidney disease  low blood pressure  previous swelling of the tongue, face, or lips with difficulty breathing, difficulty swallowing, hoarseness, or tightening of the throat  an unusual or allergic reaction to lisinopril, other ACE inhibitors, insect venom, foods, dyes, or preservatives  pregnant or trying to get pregnant  breast-feeding How should I use this medicine? Take this drug by mouth. Take it as directed on the prescription label at the same time every day. You can take it with or without food. If it upsets your stomach, take it with food. Keep taking it unless your health care provider tells you to stop. Talk to your health care provider about the use of this drug in children. While it may be prescribed for children as young as 6 for selected conditions, precautions do apply. Overdosage: If you think you have taken too much of this medicine contact a poison control center or emergency room at once. NOTE: This medicine is only for you. Do not share this medicine with others. What if I miss a dose? If you miss a dose, take it as soon as you can. If it is almost time for your next dose, take  only that dose. Do not take double or extra doses. What may interact with this medicine? Do not take this medicine with any of the following medications:  hymenoptera venom  sacubitril; valsartan This medicines may also interact with the following medications:  aliskiren  angiotensin receptor blockers, like losartan or valsartan  certain medicines for diabetes  diuretics  everolimus  gold compounds  lithium  NSAIDs, medicines for  pain and inflammation, like ibuprofen or naproxen  potassium salts or supplements  salt substitutes  sirolimus  temsirolimus This list may not describe all possible interactions. Give your health care provider a list of all the medicines, herbs, non-prescription drugs, or dietary supplements you use. Also tell them if you smoke, drink alcohol, or use illegal drugs. Some items may interact with your medicine. What should I watch for while using this medicine? Visit your doctor or health care professional for regular check ups. Check your blood pressure as directed. Ask your doctor what your blood pressure should be, and when you should contact him or her. Do not treat yourself for coughs, colds, or pain while you are using this medicine without asking your doctor or health care professional for advice. Some ingredients may increase your blood pressure. Women should inform their doctor if they wish to become pregnant or think they might be pregnant. There is a potential for serious side effects to an unborn child. Talk to your health care professional or pharmacist for more information. Check with your doctor or health care professional if you get an attack of severe diarrhea, nausea and vomiting, or if you sweat a lot. The loss of too much body fluid can make it dangerous for you to take this medicine. You may get drowsy or dizzy. Do not drive, use machinery, or do anything that needs mental alertness until you know how this drug affects you. Do not stand or sit up quickly, especially if you are an older patient. This reduces the risk of dizzy or fainting spells. Alcohol can make you more drowsy and dizzy. Avoid alcoholic drinks. Avoid salt substitutes unless you are told otherwise by your doctor or health care professional. What side effects may I notice from receiving this medicine? Side effects that you should report to your doctor or health care professional as soon as possible:  allergic  reactions like skin rash, itching or hives, swelling of the hands, feet, face, lips, throat, or tongue  breathing problems  signs and symptoms of kidney injury like trouble passing urine or change in the amount of urine  signs and symptoms of increased potassium like muscle weakness; chest pain; or fast, irregular heartbeat  signs and symptoms of liver injury like dark yellow or Holzworth urine; general ill feeling or flu-like symptoms; light-colored stools; loss of appetite; nausea; right upper belly pain; unusually weak or tired; yellowing of the eyes or skin  signs and symptoms of low blood pressure like dizziness; feeling faint or lightheaded, falls; unusually weak or tired  stomach pain with or without nausea and vomiting Side effects that usually do not require medical attention (report to your doctor or health care professional if they continue or are bothersome):  changes in taste  cough  dizziness  fever  headache  sensitivity to light This list may not describe all possible side effects. Call your doctor for medical advice about side effects. You may report side effects to FDA at 1-800-FDA-1088. Where should I keep my medicine? Keep out of the reach of children and pets.  Store at room temperature between 20 and 25 degrees C (68 and 77 degrees F). Protect from moisture. Keep the container tightly closed. Do not freeze. Avoid exposure to extreme heat. Throw away any unused drug after the expiration date. NOTE: This sheet is a summary. It may not cover all possible information. If you have questions about this medicine, talk to your doctor, pharmacist, or health care provider.  2020 Elsevier/Gold Standard (2019-07-12 11:38:35)

## 2020-12-02 NOTE — Progress Notes (Signed)
Cardiology Office Note:    Date:  12/02/2020   ID:  Kelsey Gillespie, DOB January 10, 1961, MRN [id number]  PCP:  Gordy Councilman, FNP  Cardiologist:  Thomasene Ripple, DO  Electrophysiologist:  None   Referring MD: Gordy Councilman, FNP   " I am doing well"  History of Present Illness:    Kelsey Gillespie is a 60 y.o. female with a hx of coronary artery disease status post PCI in 2013, had a left heart catheterization in January 2021 with no need for PCI, hypertension, hyperlipidemia.  At her last visit in June 2020 when she was doing well from a cardiovascular standpoint.  She is here today for follow-up visit she appears to be doing well.  She denies any chest pain, shortness of breath.  Past Medical History:  Diagnosis Date  . Angina pectoris (HCC) 10/04/2015  . Chest pain 10/04/2015  . Coronary artery disease involving native coronary artery with angina pectoris (HCC) 10/01/2015   PCI for acute RCA occlusion 03/09/12 EF 55% Cardiac cath 10/04/15:Conclusions Diagnostic Procedure Summary 1. Nonobstructive CAD. Mild restenosis of RCA and LCx. 2. Low-normal LV systolic function, EF 50% 3. Inferior hypokinesis. Diagnostic Procedure Recommendations Consider noncardiac causes of the patient's symptoms. Continue medical therapy for nonobstructive CAD. Signatures  . Crohn disease (HCC)   . Diabetes (HCC)   . Diabetic neuropathy associated with type 2 diabetes mellitus (HCC) 10/01/2020   Formatting of this note might be different from the original. Diagnosed  January 2021  . Essential hypertension 10/01/2015  . Heart attack (HCC) 02/2012  . Hyperlipidemia 10/01/2015  . Obesity (BMI 30-39.9) 02/16/2020  . Progressive angina (HCC) 10/04/2015  . Shortness of breath 01/04/2020  . Type 1 diabetes mellitus with complications (HCC) 01/04/2020  . Type 2 diabetes mellitus, with long-term current use of insulin (HCC) 10/01/2020   Formatting of this note might be different from the original. Diagnosed March, 2003     Past Surgical History:  Procedure Laterality Date  . CARDIAC CATHETERIZATION     1 stent placed  . CESAREAN SECTION    . CHOLECYSTECTOMY    . GASTRIC BYPASS    . LEFT HEART CATH AND CORONARY ANGIOGRAPHY N/A 01/11/2020   Procedure: LEFT HEART CATH AND CORONARY ANGIOGRAPHY;  Surgeon: Marykay Lex, MD;  Location: [facility] INVASIVE CV LAB;  Service: Cardiovascular;  Laterality: N/A;  . PARTIAL HYSTERECTOMY      Current Medications: Current Meds  Medication Sig  . acetaminophen (TYLENOL) 500 MG tablet Take 1,000 mg by mouth every 8 (eight) hours as needed for moderate pain or headache.  . albuterol (VENTOLIN HFA) 108 (90 Base) MCG/ACT inhaler Inhale 2 puffs into the lungs every 6 (six) hours as needed for wheezing or shortness of breath.  Marland Kitchen aspirin EC 81 MG tablet Take 81 mg by mouth daily.  Marland Kitchen atorvastatin (LIPITOR) 40 MG tablet Take 1 tablet (40 mg total) by mouth at bedtime.  . carvedilol (COREG) 3.125 MG tablet Take 1 tablet (3.125 mg total) by mouth 2 (two) times daily with a meal.  . Evolocumab 140 MG/ML SOSY Inject into the skin every 14 (fourteen) days.  . Exenatide ER (BYDUREON) 2 MG PEN Inject 2 mg into the skin every Sunday.   Marland Kitchen FIBER ADULT GUMMIES PO Take 2 capsules by mouth daily.  . fluticasone (FLONASE) 50 MCG/ACT nasal spray Place 1 spray into both nostrils daily as needed for allergies or rhinitis.  Marland Kitchen gabapentin (NEURONTIN) 300 MG capsule Take 300 mg by mouth 3 (  three) times daily.  . insulin detemir (LEVEMIR) 100 UNIT/ML injection Inject 32 Units into the skin at bedtime.   Marland Kitchen lisinopril (ZESTRIL) 10 MG tablet Take 1 tablet (10 mg total) by mouth 2 (two) times daily.  . metFORMIN (GLUCOPHAGE) 500 MG tablet Take 1,000 mg by mouth 2 (two) times daily.  . mupirocin ointment (BACTROBAN) 2 % Apply topically 3 (three) times daily.  . nitroGLYCERIN (NITROSTAT) 0.4 MG SL tablet Place 0.4 mg under the tongue every 5 (five) minutes as needed for chest pain.  Marland Kitchen omeprazole (PRILOSEC)  40 MG capsule Take 40 mg by mouth 2 (two) times daily.  . ranolazine (RANEXA) 500 MG 12 hr tablet Take 1 tablet (500 mg total) by mouth 2 (two) times daily.  . [DISCONTINUED] lisinopril (ZESTRIL) 10 MG tablet TAKE 1 TABLET(10 MG) BY MOUTH DAILY     Allergies:   Codeine and Lidocaine   Social History   Socioeconomic History  . Marital status: Married    Spouse name: Not on file  . Number of children: Not on file  . Years of education: Not on file  . Highest education level: Not on file  Occupational History  . Not on file  Tobacco Use  . Smoking status: Former Smoker    Types: Cigarettes    Quit date: 01/03/1995    Years since quitting: 25.9  . Smokeless tobacco: Never Used  Substance and Sexual Activity  . Alcohol use: Never  . Drug use: Never  . Sexual activity: Not on file  Other Topics Concern  . Not on file  Social History Narrative  . Not on file   Social Determinants of Health   Financial Resource Strain: Not on file  Food Insecurity: Not on file  Transportation Needs: Not on file  Physical Activity: Not on file  Stress: Not on file  Social Connections: Not on file     Family History: The patient's family history includes Atrial fibrillation in her brother; Diabetes in her maternal grandfather and mother; Heart attack in her father and maternal grandmother; Heart disease in her brother; Stroke in her mother.  ROS:   Review of Systems  Constitution: Negative for decreased appetite, fever and weight gain.  HENT: Negative for congestion, ear discharge, hoarse voice and sore throat.   Eyes: Negative for discharge, redness, vision loss in right eye and visual halos.  Cardiovascular: Negative for chest pain, dyspnea on exertion, leg swelling, orthopnea and palpitations.  Respiratory: Negative for cough, hemoptysis, shortness of breath and snoring.   Endocrine: Negative for heat intolerance and polyphagia.  Hematologic/Lymphatic: Negative for bleeding problem. Does  not bruise/bleed easily.  Skin: Negative for flushing, nail changes, rash and suspicious lesions.  Musculoskeletal: Negative for arthritis, joint pain, muscle cramps, myalgias, neck pain and stiffness.  Gastrointestinal: Negative for abdominal pain, bowel incontinence, diarrhea and excessive appetite.  Genitourinary: Negative for decreased libido, genital sores and incomplete emptying.  Neurological: Negative for brief paralysis, focal weakness, headaches and loss of balance.  Psychiatric/Behavioral: Negative for altered mental status, depression and suicidal ideas.  Allergic/Immunologic: Negative for HIV exposure and persistent infections.    EKGs/Labs/Other Studies Reviewed:    The following studies were reviewed today:   EKG: None today  Recent Labs: 01/04/2020: BUN 9; Creatinine, Ser 0.62; Hemoglobin 11.9; Platelets 291; Potassium 4.3; Sodium 142  Recent Lipid Panel No results found for: CHOL, TRIG, HDL, CHOLHDL, VLDL, LDLCALC, LDLDIRECT  Physical Exam:    VS:  BP (!) 160/78   Pulse 68  Ht 5\' 2"  (1.575 m)   Wt 161 lb 6.4 oz (73.2 kg)   SpO2 98%   BMI 29.52 kg/m     Wt Readings from Last 3 Encounters:  12/02/20 161 lb 6.4 oz (73.2 kg)  05/30/20 162 lb 6.4 oz (73.7 kg)  03/05/20 170 lb (77.1 kg)     GEN: Well nourished, well developed in no acute distress HEENT: Normal NECK: No JVD; No carotid bruits LYMPHATICS: No lymphadenopathy CARDIAC: S1S2 noted,RRR, no murmurs, rubs, gallops RESPIRATORY:  Clear to auscultation without rales, wheezing or rhonchi  ABDOMEN: Soft, non-tender, non-distended, +bowel sounds, no guarding. EXTREMITIES: No edema, No cyanosis, no clubbing MUSCULOSKELETAL:  No deformity  SKIN: Warm and dry NEUROLOGIC:  Alert and oriented x 3, non-focal PSYCHIATRIC:  Normal affect, good insight  ASSESSMENT:    1. Essential hypertension   2. Coronary artery disease involving native coronary artery of native heart with angina pectoris (HCC)   3. Type 1  diabetes mellitus with complications (HCC)   4. Mixed hyperlipidemia    PLAN:     Her blood pressure is elevated in the office.  This was also manually taken by me and it continues to be elevated.  I like to increase her lisinopril to 10 mg twice daily.  She will remain on carvedilol 3.125 mg twice a day.  In terms of her coronary artery disease she has no angina symptoms.  She is on aspirin 81 mg daily, atorvastatin 40 mg daily and Ranexa.  Hyperlipidemia continue patient on her Lipitor 40 mg daily.  Diabetes mellitus per PCP.  The patient is in agreement with the above plan. The patient left the office in stable condition.  The patient will follow up in 6 weeks or sooner if needed.   Medication Adjustments/Labs and Tests Ordered: Current medicines are reviewed at length with the patient today.  Concerns regarding medicines are outlined above.  Orders Placed This Encounter  Procedures  . Basic metabolic panel  . Magnesium   Meds ordered this encounter  Medications  . lisinopril (ZESTRIL) 10 MG tablet    Sig: Take 1 tablet (10 mg total) by mouth 2 (two) times daily.    Dispense:  180 tablet    Refill:  3    Patient Instructions  Medication Instructions:  Your physician has recommended you make the following change in your medication:   Take Lisinopril 10 mg twice daily.  *If you need a refill on your cardiac medications before your next appointment, please call your pharmacy*   Lab Work: Your physician recommends that you have labs done in the office today. Your test included  basic metabolic panel and magnesium.  If you have labs (blood work) drawn today and your tests are completely normal, you will receive your results only by: Marland Kitchen MyChart Message (if you have MyChart) OR . A paper copy in the mail If you have any lab test that is abnormal or we need to change your treatment, we will call you to review the results.   Testing/Procedures: None  ordered   Follow-Up: At [facility], you and your health needs are our priority.  As part of our continuing mission to provide you with exceptional heart care, we have created designated Provider Care Teams.  These Care Teams include your primary Cardiologist (physician) and Advanced Practice Providers (APPs -  Physician Assistants and Nurse Practitioners) who all work together to provide you with the care you need, when you need it.  We recommend signing  up for the patient portal called "MyChart".  Sign up information is provided on this After Visit Summary.  MyChart is used to connect with patients for Virtual Visits (Telemedicine).  Patients are able to view lab/test results, encounter notes, upcoming appointments, etc.  Non-urgent messages can be sent to your provider as well.   To learn more about what you can do with MyChart, go to [email or web address].    Your next appointment:   6 week(s)  The format for your next appointment:   In Person  Provider:   Thomasene Ripple, DO   Other Instructions Lisinopril Tablets What is this medicine? LISINOPRIL (lyse IN oh pril) is an ACE inhibitor. It treats high blood pressure and heart failure. It can treat heart damage after a heart attack. This medicine may be used for other purposes; ask your health care provider or pharmacist if you have questions. COMMON BRAND NAME(S): Prinivil, Zestril What should I tell my health care provider before I take this medicine? They need to know if you have any of these conditions:  diabetes  heart or blood vessel disease  kidney disease  low blood pressure  previous swelling of the tongue, face, or lips with difficulty breathing, difficulty swallowing, hoarseness, or tightening of the throat  an unusual or allergic reaction to lisinopril, other ACE inhibitors, insect venom, foods, dyes, or preservatives  pregnant or trying to get pregnant  breast-feeding How should I use this medicine? Take  this drug by mouth. Take it as directed on the prescription label at the same time every day. You can take it with or without food. If it upsets your stomach, take it with food. Keep taking it unless your health care provider tells you to stop. Talk to your health care provider about the use of this drug in children. While it may be prescribed for children as young as 6 for selected conditions, precautions do apply. Overdosage: If you think you have taken too much of this medicine contact a poison control center or emergency room at once. NOTE: This medicine is only for you. Do not share this medicine with others. What if I miss a dose? If you miss a dose, take it as soon as you can. If it is almost time for your next dose, take only that dose. Do not take double or extra doses. What may interact with this medicine? Do not take this medicine with any of the following medications:  hymenoptera venom  sacubitril; valsartan This medicines may also interact with the following medications:  aliskiren  angiotensin receptor blockers, like losartan or valsartan  certain medicines for diabetes  diuretics  everolimus  gold compounds  lithium  NSAIDs, medicines for pain and inflammation, like ibuprofen or naproxen  potassium salts or supplements  salt substitutes  sirolimus  temsirolimus This list may not describe all possible interactions. Give your health care provider a list of all the medicines, herbs, non-prescription drugs, or dietary supplements you use. Also tell them if you smoke, drink alcohol, or use illegal drugs. Some items may interact with your medicine. What should I watch for while using this medicine? Visit your doctor or health care professional for regular check ups. Check your blood pressure as directed. Ask your doctor what your blood pressure should be, and when you should contact him or her. Do not treat yourself for coughs, colds, or pain while you are using this  medicine without asking your doctor or health care professional for advice. Some ingredients may increase  your blood pressure. Women should inform their doctor if they wish to become pregnant or think they might be pregnant. There is a potential for serious side effects to an unborn child. Talk to your health care professional or pharmacist for more information. Check with your doctor or health care professional if you get an attack of severe diarrhea, nausea and vomiting, or if you sweat a lot. The loss of too much body fluid can make it dangerous for you to take this medicine. You may get drowsy or dizzy. Do not drive, use machinery, or do anything that needs mental alertness until you know how this drug affects you. Do not stand or sit up quickly, especially if you are an older patient. This reduces the risk of dizzy or fainting spells. Alcohol can make you more drowsy and dizzy. Avoid alcoholic drinks. Avoid salt substitutes unless you are told otherwise by your doctor or health care professional. What side effects may I notice from receiving this medicine? Side effects that you should report to your doctor or health care professional as soon as possible:  allergic reactions like skin rash, itching or hives, swelling of the hands, feet, face, lips, throat, or tongue  breathing problems  signs and symptoms of kidney injury like trouble passing urine or change in the amount of urine  signs and symptoms of increased potassium like muscle weakness; chest pain; or fast, irregular heartbeat  signs and symptoms of liver injury like dark yellow or Marling urine; general ill feeling or flu-like symptoms; light-colored stools; loss of appetite; nausea; right upper belly pain; unusually weak or tired; yellowing of the eyes or skin  signs and symptoms of low blood pressure like dizziness; feeling faint or lightheaded, falls; unusually weak or tired  stomach pain with or without nausea and vomiting Side  effects that usually do not require medical attention (report to your doctor or health care professional if they continue or are bothersome):  changes in taste  cough  dizziness  fever  headache  sensitivity to light This list may not describe all possible side effects. Call your doctor for medical advice about side effects. You may report side effects to FDA at 1-800-FDA-1088. Where should I keep my medicine? Keep out of the reach of children and pets. Store at room temperature between 20 and 25 degrees C (68 and 77 degrees F). Protect from moisture. Keep the container tightly closed. Do not freeze. Avoid exposure to extreme heat. Throw away any unused drug after the expiration date. NOTE: This sheet is a summary. It may not cover all possible information. If you have questions about this medicine, talk to your doctor, pharmacist, or health care provider.  2020 Elsevier/Gold Standard (2019-07-12 11:38:35)      Adopting a Healthy Lifestyle.  Know what a healthy weight is for you (roughly BMI <25) and aim to maintain this   Aim for 7+ servings of fruits and vegetables daily   65-80+ fluid ounces of water or unsweet tea for healthy kidneys   Limit to max 1 drink of alcohol per day; avoid smoking/tobacco   Limit animal fats in diet for cholesterol and heart health - choose grass fed whenever available   Avoid highly processed foods, and foods high in saturated/trans fats   Aim for low stress - take time to unwind and care for your mental health   Aim for 150 min of moderate intensity exercise weekly for heart health, and weights twice weekly for bone health  Aim for 7-9 hours of sleep daily   When it comes to diets, agreement about the perfect plan isnt easy to find, even among the experts. Experts at the [facility] [employer] developed an idea known as the Healthy Eating Plate. Just imagine a plate divided into logical, healthy portions.   The emphasis is on  diet quality:   Load up on vegetables and fruits - one-half of your plate: Aim for color and variety, and remember that potatoes dont count.   Go for whole grains - one-quarter of your plate: Whole wheat, barley, wheat berries, quinoa, oats, [place] rice, and foods made with them. If you want pasta, go with whole wheat pasta.   Protein power - one-quarter of your plate: Fish, chicken, beans, and nuts are all healthy, versatile protein sources. Limit red meat.   The diet, however, does go beyond the plate, offering a few other suggestions.   Use healthy plant oils, such as olive, canola, soy, corn, sunflower and peanut. Check the labels, and avoid partially hydrogenated oil, which have unhealthy trans fats.   If youre thirsty, drink water. Coffee and tea are good in moderation, but skip sugary drinks and limit milk and dairy products to one or two daily servings.   The type of carbohydrate in the diet is more important than the amount. Some sources of carbohydrates, such as vegetables, fruits, whole grains, and beans-are healthier than others.   Finally, stay active  Signed, Thomasene Ripple, DO  12/02/2020 9:40 AM    [facility]

## 2020-12-03 ENCOUNTER — Telehealth: Payer: Self-pay

## 2020-12-03 NOTE — Telephone Encounter (Signed)
Spoke with patient regarding results and recommendation.  Patient verbalizes understanding and is agreeable to plan of care. Advised patient to call back with any issues or concerns.  

## 2020-12-03 NOTE — Telephone Encounter (Signed)
-----   Message from Thomasene Ripple, DO sent at 12/03/2020  9:31 AM EST ----- Elevated blood glucose but otherwise normal labs.

## 2020-12-16 ENCOUNTER — Other Ambulatory Visit: Payer: Self-pay | Admitting: Cardiology

## 2021-01-16 ENCOUNTER — Ambulatory Visit: Payer: Medicaid Other | Admitting: Cardiology

## 2021-01-28 ENCOUNTER — Other Ambulatory Visit: Payer: Self-pay

## 2021-01-30 ENCOUNTER — Ambulatory Visit (INDEPENDENT_AMBULATORY_CARE_PROVIDER_SITE_OTHER): Payer: Medicaid Other | Admitting: Cardiology

## 2021-01-30 ENCOUNTER — Encounter: Payer: Self-pay | Admitting: Cardiology

## 2021-01-30 ENCOUNTER — Other Ambulatory Visit: Payer: Self-pay

## 2021-01-30 VITALS — BP 136/80 | HR 67 | Ht 62.0 in | Wt 159.4 lb

## 2021-01-30 DIAGNOSIS — E108 Type 1 diabetes mellitus with unspecified complications: Secondary | ICD-10-CM | POA: Diagnosis not present

## 2021-01-30 DIAGNOSIS — E1141 Type 2 diabetes mellitus with diabetic mononeuropathy: Secondary | ICD-10-CM

## 2021-01-30 DIAGNOSIS — I25119 Atherosclerotic heart disease of native coronary artery with unspecified angina pectoris: Secondary | ICD-10-CM

## 2021-01-30 DIAGNOSIS — E782 Mixed hyperlipidemia: Secondary | ICD-10-CM

## 2021-01-30 DIAGNOSIS — I10 Essential (primary) hypertension: Secondary | ICD-10-CM

## 2021-01-30 NOTE — Patient Instructions (Signed)

## 2021-01-30 NOTE — Progress Notes (Signed)
Cardiology Office Note:    Date:  01/30/2021   ID:  Kelsey Gillespie, DOB 01-05-1961, MRN [id number]  PCP:  Alinda Deem, MD  Cardiologist:  Thomasene Ripple, DO  Electrophysiologist:  None   Referring MD: Gordy Councilman, FNP   I am doing much better  History of Present Illness:    Kelsey Gillespie is a 60 y.o. female with  hx of coronary artery disease status post PCI in 2013, had a left heart catheterization in January 2021 with no need for PCI, hypertension, hyperlipidemia.  At her most recent visit on December 02, 2020 her blood pressure was elevated so increase lisinopril to 10 mg daily.  Continue her carvedilol 3.125 twice a day.  She had no anginal symptoms.  In the interim since the patient has seen me she did see [facility] cardiology, unable to see their notes for details.  She has been taking her lisinopril as well as carvedilol.  She tells me her blood pressure has improved at home.  Past Medical History:  Diagnosis Date  . Angina pectoris (HCC) 10/04/2015  . Chest pain 10/04/2015  . Coronary artery disease involving native coronary artery with angina pectoris (HCC) 10/01/2015   PCI for acute RCA occlusion 03/09/12 EF 55% Cardiac cath 10/04/15:Conclusions Diagnostic Procedure Summary 1. Nonobstructive CAD. Mild restenosis of RCA and LCx. 2. Low-normal LV systolic function, EF 50% 3. Inferior hypokinesis. Diagnostic Procedure Recommendations Consider noncardiac causes of the patient's symptoms. Continue medical therapy for nonobstructive CAD. Signatures  . Crohn disease (HCC)   . Diabetes (HCC)   . Diabetic neuropathy associated with type 2 diabetes mellitus (HCC) 10/01/2020   Formatting of this note might be different from the original. Diagnosed  January 2021  . Essential hypertension 10/01/2015  . Heart attack (HCC) 02/2012  . Hyperlipidemia 10/01/2015  . Obesity (BMI 30-39.9) 02/16/2020  . Progressive angina (HCC) 10/04/2015  . Shortness of breath 01/04/2020  . Type 1 diabetes  mellitus with complications (HCC) 01/04/2020  . Type 2 diabetes mellitus, with long-term current use of insulin (HCC) 10/01/2020   Formatting of this note might be different from the original. Diagnosed March, 2003    Past Surgical History:  Procedure Laterality Date  . CARDIAC CATHETERIZATION     1 stent placed  . CESAREAN SECTION    . CHOLECYSTECTOMY    . GASTRIC BYPASS    . LEFT HEART CATH AND CORONARY ANGIOGRAPHY N/A 01/11/2020   Procedure: LEFT HEART CATH AND CORONARY ANGIOGRAPHY;  Surgeon: Marykay Lex, MD;  Location: [facility] INVASIVE CV LAB;  Service: Cardiovascular;  Laterality: N/A;  . PARTIAL HYSTERECTOMY      Current Medications: Current Meds  Medication Sig  . acetaminophen (TYLENOL) 500 MG tablet Take 1,000 mg by mouth every 8 (eight) hours as needed for moderate pain or headache.  . albuterol (VENTOLIN HFA) 108 (90 Base) MCG/ACT inhaler Inhale 2 puffs into the lungs every 6 (six) hours as needed for wheezing or shortness of breath.  Marland Kitchen aspirin EC 81 MG tablet Take 81 mg by mouth daily.  Marland Kitchen atorvastatin (LIPITOR) 40 MG tablet Take 1 tablet (40 mg total) by mouth at bedtime.  . carvedilol (COREG) 3.125 MG tablet Take 1 tablet (3.125 mg total) by mouth 2 (two) times daily with a meal.  . Exenatide ER (BYDUREON) 2 MG PEN Inject 2 mg into the skin every Sunday.   Marland Kitchen FIBER ADULT GUMMIES PO Take 2 capsules by mouth daily.  . fluticasone (FLONASE) 50 MCG/ACT nasal spray  Place 1 spray into both nostrils daily as needed for allergies or rhinitis.  Marland Kitchen gabapentin (NEURONTIN) 300 MG capsule Take 300 mg by mouth 3 (three) times daily.  . insulin detemir (LEVEMIR) 100 UNIT/ML injection Inject 32 Units into the skin at bedtime.   Marland Kitchen lisinopril (ZESTRIL) 10 MG tablet Take 1 tablet (10 mg total) by mouth 2 (two) times daily.  Marland Kitchen loratadine (CLARITIN) 10 MG tablet Take 10 mg by mouth daily.  . metFORMIN (GLUCOPHAGE) 500 MG tablet Take 1,000 mg by mouth 2 (two) times daily.  . mupirocin ointment  (BACTROBAN) 2 % Apply topically 3 (three) times daily.  . nitroGLYCERIN (NITROSTAT) 0.4 MG SL tablet Place 0.4 mg under the tongue every 5 (five) minutes as needed for chest pain.  Marland Kitchen omeprazole (PRILOSEC) 40 MG capsule Take 40 mg by mouth 2 (two) times daily.  . ranolazine (RANEXA) 500 MG 12 hr tablet TAKE 1 TABLET(500 MG) BY MOUTH TWICE DAILY     Allergies:   Codeine and Lidocaine   Social History   Socioeconomic History  . Marital status: Married    Spouse name: Not on file  . Number of children: Not on file  . Years of education: Not on file  . Highest education level: Not on file  Occupational History  . Not on file  Tobacco Use  . Smoking status: Former Smoker    Types: Cigarettes    Quit date: 01/03/1995    Years since quitting: 26.0  . Smokeless tobacco: Never Used  Substance and Sexual Activity  . Alcohol use: Never  . Drug use: Never  . Sexual activity: Not on file  Other Topics Concern  . Not on file  Social History Narrative  . Not on file   Social Determinants of Health   Financial Resource Strain: Not on file  Food Insecurity: Not on file  Transportation Needs: Not on file  Physical Activity: Not on file  Stress: Not on file  Social Connections: Not on file     Family History: The patient's family history includes Atrial fibrillation in her brother; Diabetes in her maternal grandfather and mother; Heart attack in her father and maternal grandmother; Heart disease in her brother; Stroke in her mother.  ROS:   Review of Systems  Constitution: Negative for decreased appetite, fever and weight gain.  HENT: Negative for congestion, ear discharge, hoarse voice and sore throat.   Eyes: Negative for discharge, redness, vision loss in right eye and visual halos.  Cardiovascular: Negative for chest pain, dyspnea on exertion, leg swelling, orthopnea and palpitations.  Respiratory: Negative for cough, hemoptysis, shortness of breath and snoring.   Endocrine:  Negative for heat intolerance and polyphagia.  Hematologic/Lymphatic: Negative for bleeding problem. Does not bruise/bleed easily.  Skin: Negative for flushing, nail changes, rash and suspicious lesions.  Musculoskeletal: Negative for arthritis, joint pain, muscle cramps, myalgias, neck pain and stiffness.  Gastrointestinal: Negative for abdominal pain, bowel incontinence, diarrhea and excessive appetite.  Genitourinary: Negative for decreased libido, genital sores and incomplete emptying.  Neurological: Negative for brief paralysis, focal weakness, headaches and loss of balance.  Psychiatric/Behavioral: Negative for altered mental status, depression and suicidal ideas.  Allergic/Immunologic: Negative for HIV exposure and persistent infections.    EKGs/Labs/Other Studies Reviewed:    The following studies were reviewed today:   EKG: Sinus rhythm heart rate 67 bpm with arrhythmia.  Recent Labs: 12/02/2020: BUN 13; Creatinine, Ser 0.69; Magnesium 1.6; Potassium 5.1; Sodium 134  Recent Lipid Panel No results  found for: CHOL, TRIG, HDL, CHOLHDL, VLDL, LDLCALC, LDLDIRECT  Physical Exam:    VS:  BP 136/80   Pulse 67   Ht 5\' 2"  (1.575 m)   Wt 159 lb 6.4 oz (72.3 kg)   SpO2 96%   BMI 29.15 kg/m     Wt Readings from Last 3 Encounters:  01/30/21 159 lb 6.4 oz (72.3 kg)  12/02/20 161 lb 6.4 oz (73.2 kg)  05/30/20 162 lb 6.4 oz (73.7 kg)     GEN: Well nourished, well developed in no acute distress HEENT: Normal NECK: No JVD; No carotid bruits LYMPHATICS: No lymphadenopathy CARDIAC: S1S2 noted,RRR, no murmurs, rubs, gallops RESPIRATORY:  Clear to auscultation without rales, wheezing or rhonchi  ABDOMEN: Soft, non-tender, non-distended, +bowel sounds, no guarding. EXTREMITIES: No edema, No cyanosis, no clubbing MUSCULOSKELETAL:  No deformity  SKIN: Warm and dry NEUROLOGIC:  Alert and oriented x 3, non-focal PSYCHIATRIC:  Normal affect, good insight  ASSESSMENT:    1.  Hypertension, unspecified type   2. Coronary artery disease involving native coronary artery of native heart with angina pectoris (HCC)   3. Type 1 diabetes mellitus with complications (HCC)   4. Diabetic mononeuropathy associated with type 2 diabetes mellitus (HCC)   5. Mixed hyperlipidemia    PLAN:     1.  I am happy with her blood pressure improvement what I like to do is continue patient on lisinopril 10 mg daily with her carvedilol 3.125 twice a day.  2.  No angina symptoms continue her aspirin atorvastatin.  3.  This is being managed by his primary care doctor.  No adjustments for antidiabetic medications were made today.    The patient is in agreement with the above plan. The patient left the office in stable condition.  The patient will follow up in   Medication Adjustments/Labs and Tests Ordered: Current medicines are reviewed at length with the patient today.  Concerns regarding medicines are outlined above.  Orders Placed This Encounter  Procedures  . EKG 12-Lead   No orders of the defined types were placed in this encounter.   Patient Instructions  Medication Instructions:  Your physician recommends that you continue on your current medications as directed. Please refer to the Current Medication list given to you today.  *If you need a refill on your cardiac medications before your next appointment, please call your pharmacy*   Lab Work: None If you have labs (blood work) drawn today and your tests are completely normal, you will receive your results only by: 07/30/20 MyChart Message (if you have MyChart) OR . A paper copy in the mail If you have any lab test that is abnormal or we need to change your treatment, we will call you to review the results.   Testing/Procedures: None   Follow-Up: At [facility], you and your health needs are our priority.  As part of our continuing mission to provide you with exceptional heart care, we have created designated  Provider Care Teams.  These Care Teams include your primary Cardiologist (physician) and Advanced Practice Providers (APPs -  Physician Assistants and Nurse Practitioners) who all work together to provide you with the care you need, when you need it.  We recommend signing up for the patient portal called "MyChart".  Sign up information is provided on this After Visit Summary.  MyChart is used to connect with patients for Virtual Visits (Telemedicine).  Patients are able to view lab/test results, encounter notes, upcoming appointments, etc.  Non-urgent  messages can be sent to your provider as well.   To learn more about what you can do with MyChart, go to [email or web address].    Your next appointment:   1 year(s)  The format for your next appointment:   In Person  Provider:   Thomasene Ripple, DO   Other Instructions      Adopting a Healthy Lifestyle.  Know what a healthy weight is for you (roughly BMI <25) and aim to maintain this   Aim for 7+ servings of fruits and vegetables daily   65-80+ fluid ounces of water or unsweet tea for healthy kidneys   Limit to max 1 drink of alcohol per day; avoid smoking/tobacco   Limit animal fats in diet for cholesterol and heart health - choose grass fed whenever available   Avoid highly processed foods, and foods high in saturated/trans fats   Aim for low stress - take time to unwind and care for your mental health   Aim for 150 min of moderate intensity exercise weekly for heart health, and weights twice weekly for bone health   Aim for 7-9 hours of sleep daily   When it comes to diets, agreement about the perfect plan isnt easy to find, even among the experts. Experts at the [facility] of [employer] developed an idea known as the Healthy Eating Plate. Just imagine a plate divided into logical, healthy portions.   The emphasis is on diet quality:   Load up on vegetables and fruits - one-half of your plate: Aim for color and  variety, and remember that potatoes dont count.   Go for whole grains - one-quarter of your plate: Whole wheat, barley, wheat berries, quinoa, oats, [place] rice, and foods made with them. If you want pasta, go with whole wheat pasta.   Protein power - one-quarter of your plate: Fish, chicken, beans, and nuts are all healthy, versatile protein sources. Limit red meat.   The diet, however, does go beyond the plate, offering a few other suggestions.   Use healthy plant oils, such as olive, canola, soy, corn, sunflower and peanut. Check the labels, and avoid partially hydrogenated oil, which have unhealthy trans fats.   If youre thirsty, drink water. Coffee and tea are good in moderation, but skip sugary drinks and limit milk and dairy products to one or two daily servings.   The type of carbohydrate in the diet is more important than the amount. Some sources of carbohydrates, such as vegetables, fruits, whole grains, and beans-are healthier than others.   Finally, stay active  Signed, Thomasene Ripple, DO  01/30/2021 11:58 AM    [facility]

## 2021-04-13 ENCOUNTER — Other Ambulatory Visit: Payer: Self-pay | Admitting: Cardiology

## 2021-08-25 ENCOUNTER — Other Ambulatory Visit: Payer: Self-pay | Admitting: Cardiology

## 2021-09-24 ENCOUNTER — Other Ambulatory Visit: Payer: Self-pay | Admitting: Cardiology

## 2021-09-24 DIAGNOSIS — I10 Essential (primary) hypertension: Secondary | ICD-10-CM

## 2021-09-24 NOTE — Telephone Encounter (Signed)
Lisinopril 10 mg # 180 x 3 refills sent to  [facility] [phone] - [place], [place] - [address] AT [address] OF [address]

## 2022-03-09 ENCOUNTER — Ambulatory Visit (INDEPENDENT_AMBULATORY_CARE_PROVIDER_SITE_OTHER): Payer: Medicaid Other | Admitting: Cardiology

## 2022-03-09 ENCOUNTER — Other Ambulatory Visit: Payer: Self-pay

## 2022-03-09 ENCOUNTER — Encounter: Payer: Self-pay | Admitting: Cardiology

## 2022-03-09 VITALS — BP 130/61 | HR 55 | Ht 62.5 in | Wt 145.8 lb

## 2022-03-09 DIAGNOSIS — E1100 Type 2 diabetes mellitus with hyperosmolarity without nonketotic hyperglycemic-hyperosmolar coma (NKHHC): Secondary | ICD-10-CM | POA: Diagnosis not present

## 2022-03-09 DIAGNOSIS — Z794 Long term (current) use of insulin: Secondary | ICD-10-CM

## 2022-03-09 DIAGNOSIS — I2510 Atherosclerotic heart disease of native coronary artery without angina pectoris: Secondary | ICD-10-CM | POA: Diagnosis not present

## 2022-03-09 DIAGNOSIS — E782 Mixed hyperlipidemia: Secondary | ICD-10-CM

## 2022-03-09 DIAGNOSIS — I10 Essential (primary) hypertension: Secondary | ICD-10-CM | POA: Diagnosis not present

## 2022-03-09 NOTE — Progress Notes (Signed)
?Cardiology Office Note:   ? ?Date:  03/09/2022  ? ?ID:  Kelsey Gillespie, DOB 28-Feb-1961, MRN [id number] ? ?PCP:  Alinda Deem, MD  ?Cardiologist:  Thomasene Ripple, DO  ?Electrophysiologist:  None  ? ?Referring MD: Alinda Deem, MD  ? ?" I am doing fine" ? ? ?History of Present Illness:   ? ?Kelsey Gillespie is a 61 y.o. female with a hx of coronary artery disease status post PCI in 2013, left heart catheterization in January 2021 with no need for any PCI, hypertension, hyperlipidemia, diabetes mellitus is here today for follow-up visit. ? ?I last saw the patient in February 2022 at that time she was doing well from a cardiovascular standpoint.  I kept her on her medication regimen with no changes. ? ?Since I saw the patient it seems that she had seen cardiology at [facility] unable to see their notes. ? ?She offers no complaints today.  She tells me she has been doing well.  She denies any chest pain or shortness of breath.  She has not had any hospitalizations since I last saw the patient.  I congratulated the patient because she has lost tremendous weight since I saw her. ? ?Past Medical History:  ?Diagnosis Date  ? Angina pectoris (HCC) 10/04/2015  ? Chest pain 10/04/2015  ? Coronary artery disease involving native coronary artery with angina pectoris (HCC) 10/01/2015  ? PCI for acute RCA occlusion 03/09/12 EF 55% Cardiac cath 10/04/15:Conclusions Diagnostic Procedure Summary 1. Nonobstructive CAD. Mild restenosis of RCA and LCx. 2. Low-normal LV systolic function, EF 50% 3. Inferior hypokinesis. Diagnostic Procedure Recommendations Consider noncardiac causes of the patient's symptoms. Continue medical therapy for nonobstructive CAD. Signatures  ? Crohn disease (HCC)   ? Diabetes (HCC)   ? Diabetic neuropathy associated with type 2 diabetes mellitus (HCC) 10/01/2020  ? Formatting of this note might be different from the original. Diagnosed  January 2021  ? Essential hypertension 10/01/2015  ? Heart attack (HCC) 02/2012  ?  Hyperlipidemia 10/01/2015  ? Obesity (BMI 30-39.9) 02/16/2020  ? Progressive angina (HCC) 10/04/2015  ? Shortness of breath 01/04/2020  ? Type 1 diabetes mellitus with complications (HCC) 01/04/2020  ? Type 2 diabetes mellitus, with long-term current use of insulin (HCC) 10/01/2020  ? Formatting of this note might be different from the original. Diagnosed March, 2003  ? ? ?Past Surgical History:  ?Procedure Laterality Date  ? CARDIAC CATHETERIZATION    ? 1 stent placed  ? CESAREAN SECTION    ? CHOLECYSTECTOMY    ? GASTRIC BYPASS    ? LEFT HEART CATH AND CORONARY ANGIOGRAPHY N/A 01/11/2020  ? Procedure: LEFT HEART CATH AND CORONARY ANGIOGRAPHY;  Surgeon: Marykay Lex, MD;  Location: [facility] INVASIVE CV LAB;  Service: Cardiovascular;  Laterality: N/A;  ? PARTIAL HYSTERECTOMY    ? ? ?Current Medications: ?Current Meds  ?Medication Sig  ? acetaminophen (TYLENOL) 500 MG tablet Take 1,000 mg by mouth every 8 (eight) hours as needed for moderate pain or headache.  ? albuterol (VENTOLIN HFA) 108 (90 Base) MCG/ACT inhaler Inhale 2 puffs into the lungs every 6 (six) hours as needed for wheezing or shortness of breath.  ? aspirin EC 81 MG tablet Take 81 mg by mouth daily.  ? atorvastatin (LIPITOR) 40 MG tablet TAKE 1 TABLET(40 MG) BY MOUTH AT BEDTIME  ? fluticasone (FLONASE) 50 MCG/ACT nasal spray Place 1 spray into both nostrils daily as needed for allergies or rhinitis.  ? gabapentin (NEURONTIN) 300 MG capsule Take 300  mg by mouth 3 (three) times daily.  ? insulin detemir (LEVEMIR) 100 UNIT/ML injection Inject 32 Units into the skin at bedtime.   ? lisinopril (ZESTRIL) 10 MG tablet TAKE 1 TABLET(10 MG) BY MOUTH TWICE DAILY  ? loratadine (CLARITIN) 10 MG tablet Take 10 mg by mouth daily.  ? metFORMIN (GLUCOPHAGE) 500 MG tablet Take 1,000 mg by mouth 2 (two) times daily.  ? nitroGLYCERIN (NITROSTAT) 0.4 MG SL tablet Place 0.4 mg under the tongue every 5 (five) minutes as needed for chest pain.  ? pantoprazole (PROTONIX) 40 MG  tablet Take 40 mg by mouth daily.  ? ranolazine (RANEXA) 500 MG 12 hr tablet TAKE 1 TABLET(500 MG) BY MOUTH TWICE DAILY  ?  ? ?Allergies:   Codeine and Lidocaine  ? ?Social History  ? ?Socioeconomic History  ? Marital status: Married  ?  Spouse name: Not on file  ? Number of children: Not on file  ? Years of education: Not on file  ? Highest education level: Not on file  ?Occupational History  ? Not on file  ?Tobacco Use  ? Smoking status: Former  ?  Types: Cigarettes  ?  Quit date: 01/03/1995  ?  Years since quitting: 27.1  ? Smokeless tobacco: Never  ?Substance and Sexual Activity  ? Alcohol use: Never  ? Drug use: Never  ? Sexual activity: Not on file  ?Other Topics Concern  ? Not on file  ?Social History Narrative  ? Not on file  ? ?Social Determinants of Health  ? ?Financial Resource Strain: Not on file  ?Food Insecurity: Not on file  ?Transportation Needs: Not on file  ?Physical Activity: Not on file  ?Stress: Not on file  ?Social Connections: Not on file  ?  ? ?Family History: ?The patient's family history includes Atrial fibrillation in her brother; Diabetes in her maternal grandfather and mother; Heart attack in her father and maternal grandmother; Heart disease in her brother; Stroke in her mother. ? ?ROS:   ?Review of Systems  ?Constitution: Negative for decreased appetite, fever and weight gain.  ?HENT: Negative for congestion, ear discharge, hoarse voice and sore throat.   ?Eyes: Negative for discharge, redness, vision loss in right eye and visual halos.  ?Cardiovascular: Negative for chest pain, dyspnea on exertion, leg swelling, orthopnea and palpitations.  ?Respiratory: Negative for cough, hemoptysis, shortness of breath and snoring.   ?Endocrine: Negative for heat intolerance and polyphagia.  ?Hematologic/Lymphatic: Negative for bleeding problem. Does not bruise/bleed easily.  ?Skin: Negative for flushing, nail changes, rash and suspicious lesions.  ?Musculoskeletal: Negative for arthritis, joint  pain, muscle cramps, myalgias, neck pain and stiffness.  ?Gastrointestinal: Negative for abdominal pain, bowel incontinence, diarrhea and excessive appetite.  ?Genitourinary: Negative for decreased libido, genital sores and incomplete emptying.  ?Neurological: Negative for brief paralysis, focal weakness, headaches and loss of balance.  ?Psychiatric/Behavioral: Negative for altered mental status, depression and suicidal ideas.  ?Allergic/Immunologic: Negative for HIV exposure and persistent infections.  ? ? ?EKGs/Labs/Other Studies Reviewed:   ? ?The following studies were reviewed today: ? ? ?EKG:  The ekg ordered today demonstrates  ? ?Recent Labs: ?No results found for requested labs within last 8760 hours.  ?Recent Lipid Panel ?No results found for: CHOL, TRIG, HDL, CHOLHDL, VLDL, LDLCALC, LDLDIRECT ? ?Physical Exam:   ? ?VS:  BP 130/61   Pulse (!) 55   Ht 5' 2.5" (1.588 m)   Wt 145 lb 12.8 oz (66.1 kg)   SpO2 99%   BMI  26.24 kg/m?    ? ?Wt Readings from Last 3 Encounters:  ?03/09/22 145 lb 12.8 oz (66.1 kg)  ?01/30/21 159 lb 6.4 oz (72.3 kg)  ?12/02/20 161 lb 6.4 oz (73.2 kg)  ?  ? ?GEN: Well nourished, well developed in no acute distress ?HEENT: Normal ?NECK: No JVD; No carotid bruits ?LYMPHATICS: No lymphadenopathy ?CARDIAC: S1S2 noted,RRR, no murmurs, rubs, gallops ?RESPIRATORY:  Clear to auscultation without rales, wheezing or rhonchi  ?ABDOMEN: Soft, non-tender, non-distended, +bowel sounds, no guarding. ?EXTREMITIES: No edema, No cyanosis, no clubbing ?MUSCULOSKELETAL:  No deformity  ?SKIN: Warm and dry ?NEUROLOGIC:  Alert and oriented x 3, non-focal ?PSYCHIATRIC:  Normal affect, good insight ? ?ASSESSMENT:   ? ?1. Primary hypertension   ?2. Coronary artery disease involving native coronary artery of native heart without angina pectoris   ?3. Mixed hyperlipidemia   ?4. Type 2 diabetes mellitus with hyperosmolarity without coma, with long-term current use of insulin (HCC)   ? ?PLAN:   ? ? ?1.   Coronary artery disease-no angina symptoms continue patient on aspirin and atorvastatin. ? ?2.  Hypertension-blood pressure is acceptable, continue with current antihypertensive regimen. ? ?3.  Hyperlipidemia - continue

## 2022-03-09 NOTE — Patient Instructions (Signed)

## 2022-04-20 ENCOUNTER — Ambulatory Visit: Payer: Medicaid Other | Admitting: Physician Assistant

## 2022-04-20 ENCOUNTER — Encounter: Payer: Self-pay | Admitting: Physician Assistant

## 2022-04-20 VITALS — BP 140/90 | HR 74 | Ht 62.5 in | Wt 149.2 lb

## 2022-04-20 DIAGNOSIS — R1084 Generalized abdominal pain: Secondary | ICD-10-CM | POA: Diagnosis not present

## 2022-04-20 DIAGNOSIS — D509 Iron deficiency anemia, unspecified: Secondary | ICD-10-CM | POA: Diagnosis not present

## 2022-04-20 DIAGNOSIS — R1013 Epigastric pain: Secondary | ICD-10-CM | POA: Diagnosis not present

## 2022-04-20 DIAGNOSIS — K5909 Other constipation: Secondary | ICD-10-CM | POA: Diagnosis not present

## 2022-04-20 MED ORDER — NA SULFATE-K SULFATE-MG SULF 17.5-3.13-1.6 GM/177ML PO SOLN: 1.0000 | ORAL | 0 refills | Status: DC

## 2022-04-20 NOTE — Progress Notes (Signed)
? ?Chief Complaint: Abdominal pain and anemia ? ?HPI: ?   Kelsey Gillespie is a 61 year old female with a past medical history as listed below including CAD status post PCI in 2013 (02/21/2020 echo LVEF 60-65%), diabetes and multiple others listed below, who was referred to me by Greig Right, MD for a complaint of abdominal pain and anemia. ?   03/16/2022 patient seen by PCP at that time discussed history of reflux and she was describing some burning and nausea better with a PPI.  Discussed possible history of ulcerative colitis also discussed iron deficiency and some right lower quadrant abdominal pain.  She was scheduled for a CT and labs.  CBC with a hemoglobin of 10.2.  CMP with an alk phos minimally elevated at 142 and otherwise normal liver enzymes.  Iron low at 36.  Ferritin low at 11.9.  Hemoglobin A1c 8.2. ?   03/31/2022 CT of the abdomen pelvis with contrast showed mild bladder wall thickening with possible infectious or inflammatory cystitis and aortic atherosclerosis as well as diverticulosis without diverticulitis. ?   Today, the patient tells me that she suffers from chronic constipation.  Recently this has been made worse with the addition of Iron supplementation over the past couple of weeks.  She has really not had a significant bowel movement for 2 weeks.  Did pass "a little bit of stool that look like sand and mucus" about a week ago when she used a suppository but otherwise is just passing very smelly gas.  Associated symptoms include a generalized abdominal pain which wraps around to her back as well as nausea after eating.  Has tried to limit her diet due to this feeling.  Currently taking over-the-counter iron supplementation daily. ?   Also discusses an epigastric burning pain right upper underneath her ribs.  Explains she had a previous gastric bypass in 2010 and is on Pantoprazole 40 mg daily. ?   Vague history of a "lesion on her ileum", apparently on her first colonoscopy in 2016 or so.   Apparently had a recent repeat in 2019 and was told it was normal and she did not need to repeat for 10 years. ?   Denies fever, chills, weight loss, blood in her stool or symptoms that awaken her from sleep. ? ?Past Medical History:  ?Diagnosis Date  ? Angina pectoris ([place]) 10/04/2015  ? Chest pain 10/04/2015  ? Coronary artery disease involving native coronary artery with angina pectoris ([place]) 10/01/2015  ? PCI for acute RCA occlusion 03/09/12 EF 55% Cardiac cath 10/04/15:Conclusions Diagnostic Procedure Summary 1. Nonobstructive CAD. Mild restenosis of RCA and LCx. 2. Low-normal LV systolic function, EF 00% 3. Inferior hypokinesis. Diagnostic Procedure Recommendations Consider noncardiac causes of the patient's symptoms. Continue medical therapy for nonobstructive CAD. Signatures  ? Crohn disease ([place])   ? Diabetes ([place])   ? Diabetic neuropathy associated with type 2 diabetes mellitus ([place]) 10/01/2020  ? Formatting of this note might be different from the original. Diagnosed  January 2021  ? Essential hypertension 10/01/2015  ? Heart attack ([place]) 02/2012  ? Hyperlipidemia 10/01/2015  ? Obesity (BMI 30-39.9) 02/16/2020  ? Progressive angina ([place]) 10/04/2015  ? Shortness of breath 01/04/2020  ? Type 1 diabetes mellitus with complications ([place]) 9/38/1829  ? Type 2 diabetes mellitus, with long-term current use of insulin ([place]) 10/01/2020  ? Formatting of this note might be different from the original. Diagnosed March, 2003  ? ? ?Past Surgical History:  ?Procedure Laterality Date  ? CARDIAC CATHETERIZATION    ?  1 stent placed  ? CESAREAN SECTION    ? CHOLECYSTECTOMY    ? GASTRIC BYPASS    ? LEFT HEART CATH AND CORONARY ANGIOGRAPHY N/A 01/11/2020  ? Procedure: LEFT HEART CATH AND CORONARY ANGIOGRAPHY;  Surgeon: Leonie Man, MD;  Location: [facility];  Service: Cardiovascular;  Laterality: N/A;  ? PARTIAL HYSTERECTOMY    ? ? ?Current Outpatient Medications  ?Medication Sig Dispense Refill  ? acetaminophen  (TYLENOL) 500 MG tablet Take 1,000 mg by mouth every 8 (eight) hours as needed for moderate pain or headache.    ? albuterol (VENTOLIN HFA) 108 (90 Base) MCG/ACT inhaler Inhale 2 puffs into the lungs every 6 (six) hours as needed for wheezing or shortness of breath.    ? aspirin EC 81 MG tablet Take 81 mg by mouth daily.    ? atorvastatin (LIPITOR) 40 MG tablet TAKE 1 TABLET(40 MG) BY MOUTH AT BEDTIME 90 tablet 2  ? carvedilol (COREG) 3.125 MG tablet Take 1 tablet (3.125 mg total) by mouth 2 (two) times daily with a meal. 180 tablet 1  ? fluticasone (FLONASE) 50 MCG/ACT nasal spray Place 1 spray into both nostrils daily as needed for allergies or rhinitis.    ? gabapentin (NEURONTIN) 300 MG capsule Take 300 mg by mouth 3 (three) times daily.    ? insulin detemir (LEVEMIR) 100 UNIT/ML injection Inject 32 Units into the skin at bedtime.     ? lisinopril (ZESTRIL) 10 MG tablet TAKE 1 TABLET(10 MG) BY MOUTH TWICE DAILY 180 tablet 3  ? loratadine (CLARITIN) 10 MG tablet Take 10 mg by mouth daily.    ? metFORMIN (GLUCOPHAGE) 500 MG tablet Take 1,000 mg by mouth 2 (two) times daily.    ? nitroGLYCERIN (NITROSTAT) 0.4 MG SL tablet Place 0.4 mg under the tongue every 5 (five) minutes as needed for chest pain.    ? pantoprazole (PROTONIX) 40 MG tablet Take 40 mg by mouth daily.    ? ranolazine (RANEXA) 500 MG 12 hr tablet TAKE 1 TABLET(500 MG) BY MOUTH TWICE DAILY 180 tablet 3  ? ?No current facility-administered medications for this visit.  ? ? ?Allergies as of 04/20/2022 - Review Complete 03/09/2022  ?Allergen Reaction Noted  ? Codeine Itching 10/04/2015  ? Lidocaine Rash 02/16/2020  ? ? ?Family History  ?Problem Relation Age of Onset  ? Stroke Mother   ? Diabetes Mother   ? Heart attack Father   ? Heart disease Brother   ? Heart attack Maternal Grandmother   ? Diabetes Maternal Grandfather   ? Atrial fibrillation Brother   ? ? ?Social History  ? ?Socioeconomic History  ? Marital status: Married  ?  Spouse name: Not on file   ? Number of children: Not on file  ? Years of education: Not on file  ? Highest education level: Not on file  ?Occupational History  ? Not on file  ?Tobacco Use  ? Smoking status: Former  ?  Types: Cigarettes  ?  Quit date: 01/03/1995  ?  Years since quitting: 27.3  ? Smokeless tobacco: Never  ?Substance and Sexual Activity  ? Alcohol use: Never  ? Drug use: Never  ? Sexual activity: Not on file  ?Other Topics Concern  ? Not on file  ?Social History Narrative  ? Not on file  ? ?Social Determinants of Health  ? ?Financial Resource Strain: Not on file  ?Food Insecurity: Not on file  ?Transportation Needs: Not on file  ?Physical Activity:  Not on file  ?Stress: Not on file  ?Social Connections: Not on file  ?Intimate Partner Violence: Not on file  ? ? ?Review of Systems:    ?Constitutional: No weight loss, fever or chills ?Skin: No rash ?Cardiovascular: No chest pain ?Respiratory: No SOB ?Gastrointestinal: See HPI and otherwise negative ?Genitourinary: No dysuria ?Neurological: No headache, dizziness or syncope ?Musculoskeletal: No new muscle or joint pain ?Hematologic: No bleeding  ?Psychiatric: No history of depression or anxiety ? ? Physical Exam:  ?Vital signs: ?BP 140/90   Pulse 74   Ht 5' 2.5" (1.588 m)   Wt 149 lb 3.2 oz (67.7 kg)   SpO2 98%   BMI 26.85 kg/m?   ? ?Constitutional:   Pleasant Caucasian female appears to be in NAD, Well developed, Well nourished, alert and cooperative ?Head:  Normocephalic and atraumatic. ?Eyes:   PEERL, EOMI. No icterus. Conjunctiva pink. ?Ears:  Normal auditory acuity. ?Neck:  Supple ?Throat: Oral cavity and pharynx without inflammation, swelling or lesion.  ?Respiratory: Respirations even and unlabored. Lungs clear to auscultation bilaterally.   No wheezes, crackles, or rhonchi.  ?Cardiovascular: Normal S1, S2. No MRG. Regular rate and rhythm. No peripheral edema, cyanosis or pallor.  ?Gastrointestinal:  Soft, moderate distention, mild generalized TTP, no rebound or  guarding.  Decreased bowel sounds all 4 quadrants. No appreciable masses or hepatomegaly. ?Rectal:  Not performed.  ?Msk:  Symmetrical without gross deformities. Without edema, no deformity or joint abnormality.  ?Neurolog

## 2022-04-20 NOTE — Patient Instructions (Addendum)
Continue daily Iron supplement. ? ?Victorino Dike recommends that you complete a bowel purge (to clean out your bowels). Please do the following: ?Purchase a bottle of Miralax over the counter as well as a box of 5 mg dulcolax tablets. ?Take 4 dulcolax tablets. ?Wait 1 hour. ?You will then drink 6-8 capfuls of Miralax mixed in an adequate amount of water/juice/gatorade (you may choose which of these liquids to drink) over the next 2-3 hours. ?You should expect results within 1 to 6 hours after completing the bowel purge. ? ?You have been scheduled for an endoscopy and colonoscopy. Please follow the written instructions given to you at your visit today. ?Please pick up your prep supplies at the pharmacy within the next 1-3 days. ?If you use inhalers (even only as needed), please bring them with you on the day of your procedure. ? ?We have sent the following medications to your pharmacy for you to pick up at your convenience: ?Suprep  ? ? ?Thank you for choosing me and [facility]. ? ?Hyacinth Meeker PA  ? ?

## 2022-04-23 NOTE — Progress Notes (Signed)
Agree with the assessment and plan as outlined by Jennifer Lemmon, PA-C. ? ?Lenix Kidd E. Jeania Nater, MD ? ?

## 2022-05-20 ENCOUNTER — Encounter: Payer: Self-pay | Admitting: Gastroenterology

## 2022-05-20 ENCOUNTER — Ambulatory Visit (AMBULATORY_SURGERY_CENTER): Payer: Medicaid Other | Admitting: Gastroenterology

## 2022-05-20 VITALS — BP 125/59 | HR 77 | Temp 97.8°F | Resp 19 | Ht 62.0 in | Wt 149.0 lb

## 2022-05-20 DIAGNOSIS — K289 Gastrojejunal ulcer, unspecified as acute or chronic, without hemorrhage or perforation: Secondary | ICD-10-CM | POA: Diagnosis not present

## 2022-05-20 DIAGNOSIS — K2100 Gastro-esophageal reflux disease with esophagitis, without bleeding: Secondary | ICD-10-CM

## 2022-05-20 DIAGNOSIS — R1084 Generalized abdominal pain: Secondary | ICD-10-CM

## 2022-05-20 DIAGNOSIS — D509 Iron deficiency anemia, unspecified: Secondary | ICD-10-CM

## 2022-05-20 DIAGNOSIS — K633 Ulcer of intestine: Secondary | ICD-10-CM

## 2022-05-20 MED ORDER — SODIUM CHLORIDE 0.9 % IV SOLN: 500.0000 mL | Freq: Once | INTRAVENOUS | Status: DC

## 2022-05-20 MED ORDER — OMEPRAZOLE 40 MG PO CPDR: 40.0000 mg | DELAYED_RELEASE_CAPSULE | Freq: Two times a day (BID) | ORAL | 1 refills | Status: DC

## 2022-05-20 MED ORDER — SUCRALFATE 1 G PO TABS: 1.0000 g | ORAL_TABLET | Freq: Four times a day (QID) | ORAL | 1 refills | Status: DC

## 2022-05-20 NOTE — Progress Notes (Signed)
Pt's states no medical or surgical changes since previsit or office visit. 

## 2022-05-20 NOTE — Progress Notes (Unsigned)
Called to room to assist during endoscopic procedure.  Patient ID and intended procedure confirmed with present staff. Received instructions for my participation in the procedure from the performing physician.  

## 2022-05-20 NOTE — Progress Notes (Unsigned)
Pt in recovery with monitors in place, VSS. Report given to receiving RN. Bite guard was placed with pt awake to ensure comfort. No dental or soft tissue damage noted. 

## 2022-05-20 NOTE — Op Note (Signed)
[facility] Patient Name: Kelsey Gillespie Procedure Date: 05/20/2022 2:05 PM MRN: [id number] Endoscopist: Lorin Picket E. Tomasa Rand , MD Age: 61 Referring MD:  Date of Birth: 1961-11-06 Gender: Female Account #: [id number] Procedure:                Colonoscopy Indications:              Generalized abdominal pain, Iron deficiency anemia Medicines:                Monitored Anesthesia Care Procedure:                Pre-Anesthesia Assessment:                           - Prior to the procedure, a History and Physical                            was performed, and patient medications and                            allergies were reviewed. The patient's tolerance of                            previous anesthesia was also reviewed. The risks                            and benefits of the procedure and the sedation                            options and risks were discussed with the patient.                            All questions were answered, and informed consent                            was obtained. Prior Anticoagulants: The patient has                            taken no previous anticoagulant or antiplatelet                            agents except for aspirin. ASA Grade Assessment:                            III - A patient with severe systemic disease. After                            reviewing the risks and benefits, the patient was                            deemed in satisfactory condition to undergo the                            procedure.  After obtaining informed consent, the colonoscope                            was passed under direct vision. Throughout the                            procedure, the patient's blood pressure, pulse, and                            oxygen saturations were monitored continuously. The                            CF [id number] #[id number] was introduced through the anus                            and advanced to the the terminal  ileum, with                            identification of the appendiceal orifice and IC                            valve. The colonoscopy was performed without                            difficulty. The patient tolerated the procedure                            well. The quality of the bowel preparation was                            adequate. The terminal ileum, ileocecal valve,                            appendiceal orifice, and rectum were photographed. Scope In: 2:29:48 PM Scope Out: 2:48:47 PM Scope Withdrawal Time: 0 hours 11 minutes 49 seconds  Total Procedure Duration: 0 hours 18 minutes 59 seconds  Findings:                 Hemorrhoids were found on perianal exam.                           The digital rectal exam was normal. Pertinent                            negatives include normal sphincter tone and no                            palpable rectal lesions.                           A patchy area of mildly erythematous mucosa was                            found in the sigmoid colon and in the descending  colon. Biopsies were taken with a cold forceps for                            histology. Estimated blood loss was minimal.                           The exam was otherwise normal throughout the                            examined colon.                           The terminal ileum contained multiple ulcers. No                            bleeding was present. No stigmata of recent                            bleeding were seen. Biopsies were taken with a cold                            forceps for histology. Estimated blood loss was                            minimal.                           The remainder of the exam in the terminal ileum was                            normal.                           Non-bleeding internal hemorrhoids were found during                            retroflexion. The hemorrhoids were Grade I                             (internal hemorrhoids that do not prolapse).                           No additional abnormalities were found on                            retroflexion. Complications:            No immediate complications. Estimated Blood Loss:     Estimated blood loss was minimal. Impression:               - Hemorrhoids found on perianal exam.                           - Erythematous mucosa in the sigmoid colon and in  the descending colon. Biopsied.                           - Multiple ulcers in the terminal ileum. Biopsied.                            These may be related to aspirin usage. Crohn's                            disease also possible.                           - Non-bleeding internal hemorrhoids. Recommendation:           - Patient has a contact number available for                            emergencies. The signs and symptoms of potential                            delayed complications were discussed with the                            patient. Return to normal activities tomorrow.                            Written discharge instructions were provided to the                            patient.                           - Resume previous diet.                           - Continue present medications.                           - Await pathology results.                           - Repeat colonoscopy in 10 years for screening                            purposes.                           - Further recommendations will be based on biopsy                            results Destry Dauber E. Tomasa Rand, MD 05/20/2022 3:07:37 PM This report has been signed electronically.

## 2022-05-20 NOTE — Patient Instructions (Signed)
Please read handouts provided. Stop Protonix. Begin Prilosec ( omeprazole ) 40 mg twice daily for 8 weeks. Begin sucralfate tablets 1 gram 4 times daily for 4 weeks. Avoid NSAIDS. Upper endoscopy in 8 weeks. Continue present medications. Repeat colonoscopy in 10 years for screening.   YOU HAD AN ENDOSCOPIC PROCEDURE TODAY AT THE [facility]:   Refer to the procedure report that was given to you for any specific questions about what was found during the examination.  If the procedure report does not answer your questions, please call your gastroenterologist to clarify.  If you requested that your care partner not be given the details of your procedure findings, then the procedure report has been included in a sealed envelope for you to review at your convenience later.  YOU SHOULD EXPECT: Some feelings of bloating in the abdomen. Passage of more gas than usual.  Walking can help get rid of the air that was put into your GI tract during the procedure and reduce the bloating. If you had a lower endoscopy (such as a colonoscopy or flexible sigmoidoscopy) you may notice spotting of blood in your stool or on the toilet paper. If you underwent a bowel prep for your procedure, you may not have a normal bowel movement for a few days.  Please Note:  You might notice some irritation and congestion in your nose or some drainage.  This is from the oxygen used during your procedure.  There is no need for concern and it should clear up in a day or so.  SYMPTOMS TO REPORT IMMEDIATELY:  Following lower endoscopy (colonoscopy or flexible sigmoidoscopy):  Excessive amounts of blood in the stool  Significant tenderness or worsening of abdominal pains  Swelling of the abdomen that is new, acute  Fever of 100F or higher  Following upper endoscopy (EGD)  Vomiting of blood or coffee ground material  New chest pain or pain under the shoulder blades  Painful or persistently difficult swallowing  New  shortness of breath  Fever of 100F or higher  Black, tarry-looking stools  For urgent or emergent issues, a gastroenterologist can be reached at any hour by calling [phone]. Do not use MyChart messaging for urgent concerns.    DIET:  We do recommend a small meal at first, but then you may proceed to your regular diet.  Drink plenty of fluids but you should avoid alcoholic beverages for 24 hours.  ACTIVITY:  You should plan to take it easy for the rest of today and you should NOT DRIVE or use heavy machinery until tomorrow (because of the sedation medicines used during the test).    FOLLOW UP: Our staff will call the number listed on your records 48-72 hours following your procedure to check on you and address any questions or concerns that you may have regarding the information given to you following your procedure. If we do not reach you, we will leave a message.  We will attempt to reach you two times.  During this call, we will ask if you have developed any symptoms of COVID 19. If you develop any symptoms (ie: fever, flu-like symptoms, shortness of breath, cough etc.) before then, please call [phone].  If you test positive for Covid 19 in the 2 weeks post procedure, please call and report this information to [place].    If any biopsies were taken you will be contacted by phone or by letter within the next 1-3 weeks.  Please call us at [phone]  [phone] if you have not heard about the biopsies in 3 weeks.    SIGNATURES/CONFIDENTIALITY: You and/or your care partner have signed paperwork which will be entered into your electronic medical record.  These signatures attest to the fact that that the information above on your After Visit Summary has been reviewed and is understood.  Full responsibility of the confidentiality of this discharge information lies with you and/or your care-partner.

## 2022-05-20 NOTE — Progress Notes (Unsigned)
History and Physical Interval Note:  05/20/2022 2:03 PM  Kelsey Gillespie  has presented today for endoscopic procedure(s), with the diagnosis of  Encounter Diagnosis  Name Primary?   Iron deficiency anemia, unspecified iron deficiency anemia type Yes  .  The various methods of evaluation and treatment have been discussed with the patient and/or family. After consideration of risks, benefits and other options for treatment, the patient has consented to  the endoscopic procedure(s).   The patient's history has been reviewed, patient examined, no change in status, stable for endoscopic procedure(s).  I have reviewed the patient's chart and labs.  Questions were answered to the patient's satisfaction.     Dennies Coate E. Tomasa Rand, MD [facility]

## 2022-05-20 NOTE — Op Note (Signed)
[place] Patient Name: Kelsey Gillespie Procedure Date: 05/20/2022 2:05 PM MRN: [id number] Endoscopist: Nicki Reaper E. Candis Schatz , MD Age: 61 Referring MD:  Date of Birth: [birth year] Gender: Female Account #: [id number] Procedure:                Upper GI endoscopy Indications:              Generalized abdominal pain, Iron deficiency anemia Medicines:                Monitored Anesthesia Care Procedure:                Pre-Anesthesia Assessment:                           - Prior to the procedure, a History and Physical                            was performed, and patient medications and                            allergies were reviewed. The patient's tolerance of                            previous anesthesia was also reviewed. The risks                            and benefits of the procedure and the sedation                            options and risks were discussed with the patient.                            All questions were answered, and informed consent                            was obtained. Prior Anticoagulants: The patient has                            taken no previous anticoagulant or antiplatelet                            agents except for aspirin. ASA Grade Assessment:                            III - A patient with severe systemic disease. After                            reviewing the risks and benefits, the patient was                            deemed in satisfactory condition to undergo the                            procedure.  After obtaining informed consent, the endoscope was                            passed under direct vision. Throughout the                            procedure, the patient's blood pressure, pulse, and                            oxygen saturations were monitored continuously. The                            Endoscope was introduced through the mouth, and                            advanced to the efferent  jejunal loop. The upper GI                            endoscopy was accomplished without difficulty. The                            patient tolerated the procedure well. Scope In: Scope Out: Findings:                 The examined portions of the nasopharynx,                            oropharynx and larynx were normal.                           LA Grade A (one or more mucosal breaks less than 5                            mm, not extending between tops of 2 mucosal folds)                            esophagitis with no bleeding was found at the                            gastroesophageal junction.                           The exam of the esophagus was otherwise normal.                           Evidence of a gastric bypass was found. A gastric                            pouch with a large size was found. The staple line                            appeared intact. The gastrojejunal anastomosis was  characterized by edema and erythema. This was                            traversed.                           Normal mucosa was found in the entire examined                            stomach. Biopsies were taken with a cold forceps                            for Helicobacter pylori testing. Estimated blood                            loss was minimal.                           Many non-bleeding superficial ulcers with no                            stigmata of bleeding were found in the jejunum,                            just distal to the gastrojejunal anastomosis. The                            largest lesion was 5 mm in largest dimension.                            Biopsies were taken with a cold forceps for                            Helicobacter pylori testing. Estimated blood loss                            was minimal.                           Exam of the jejunum was otherwise normal. Complications:            No immediate complications. Estimated Blood Loss:      Estimated blood loss was minimal. Impression:               - The examined portions of the nasopharynx,                            oropharynx and larynx were normal.                           - LA Grade A reflux esophagitis with no bleeding.                           - Gastric bypass with a large-sized pouch and  intact staple line. Gastrojejunal anastomosis                            characterized by edema and erythema.                           - Normal mucosa was found in the entire stomach.                            Biopsied.                           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 Biopsied. This is the likely source of                            the patient's abdominal pain and iron deficiency. Recommendation:           - Patient has a contact number available for                            emergencies. The signs and symptoms of potential                            delayed complications were discussed with the                            patient. Return to normal activities tomorrow.                            Written discharge instructions were provided to the                            patient.                           - Resume previous diet.                           - Use Prilosec (omeprazole) 40 mg PO BID for 8                            weeks.                           - Use sucralfate tablets 1 gram PO QID for 4 weeks.                           - Stop Protonix                           - Repeat upper endoscopy in 8 weeks to check  healing.                           - Avoid NSAIDs Lilyona Richner E. Candis Schatz, MD 05/20/2022 3:02:36 PM This report has been signed electronically.

## 2022-06-02 ENCOUNTER — Encounter: Payer: Self-pay | Admitting: *Deleted

## 2022-06-02 NOTE — Progress Notes (Signed)
Ms. Frigon,  The biopsies of your jejunum showed inflammatory changes but no evidence of infection.   The biopsies of your stomach (gastric pouch) were unremarkable.  There was no evidence of H. pylori infection. The biopsies of the ulcers in your terminal ileum did not show any evidence of chronic inflammation to suggest disease.  There is no evidence of infection. The biopsies of your colon were unremarkable.  There was no evidence of chronic inflammation or precancerous changes Please continue to take your acid suppressing medication twice a day repeat upper endoscopy to assess healing of your ulcers as discussed.

## 2022-07-03 ENCOUNTER — Other Ambulatory Visit: Payer: Self-pay | Admitting: Gastroenterology

## 2022-07-16 ENCOUNTER — Telehealth: Payer: Self-pay | Admitting: Gastroenterology

## 2022-07-16 ENCOUNTER — Encounter: Payer: Medicaid Other | Admitting: Gastroenterology

## 2022-07-16 NOTE — Telephone Encounter (Signed)
Good Afternoon Dr. Tomasa Rand,  I called patient at 12:45pm she stated she thought it was for tomorrow.  She will call back to reschedule.  I will NO SHOW Patient-- Medicaid

## 2022-08-06 ENCOUNTER — Ambulatory Visit (AMBULATORY_SURGERY_CENTER): Payer: Medicaid Other | Admitting: Gastroenterology

## 2022-08-06 ENCOUNTER — Encounter: Payer: Self-pay | Admitting: Gastroenterology

## 2022-08-06 VITALS — BP 109/65 | HR 72 | Temp 97.1°F | Resp 11

## 2022-08-06 DIAGNOSIS — Z8719 Personal history of other diseases of the digestive system: Secondary | ICD-10-CM | POA: Diagnosis not present

## 2022-08-06 DIAGNOSIS — D509 Iron deficiency anemia, unspecified: Secondary | ICD-10-CM

## 2022-08-06 DIAGNOSIS — K449 Diaphragmatic hernia without obstruction or gangrene: Secondary | ICD-10-CM

## 2022-08-06 DIAGNOSIS — K289 Gastrojejunal ulcer, unspecified as acute or chronic, without hemorrhage or perforation: Secondary | ICD-10-CM

## 2022-08-06 MED ORDER — SODIUM CHLORIDE 0.9 % IV SOLN: 500.0000 mL | Freq: Once | INTRAVENOUS | Status: DC

## 2022-08-06 NOTE — Progress Notes (Signed)
Sedate, gd SR, tolerated procedure well, VSS, report to RN 

## 2022-08-06 NOTE — Progress Notes (Signed)
Pt's states no medical or surgical changes since previsit or office visit. 

## 2022-08-06 NOTE — Progress Notes (Signed)
[facility] History and Physical   Primary Care Physician:  Alinda Deem, MD   Reason for Procedure:   Follow up marginal ulcers  Plan:    EGD     HPI: Kelsey Gillespie is a 61 y.o. female undergoing repeat EGD after she was found to have numerous ulcers on the jejunal side of her gastrojejunal anastomosis on EGD in May 2023.  She has been taking omeprazole twice daily as well as carafate and feels much better.  She has occasional nausea, but no abdominal pain.   Past Medical History:  Diagnosis Date   Angina pectoris (HCC) 10/04/2015   Chest pain 10/04/2015   Coronary artery disease involving native coronary artery with angina pectoris (HCC) 10/01/2015   PCI for acute RCA occlusion 03/09/12 EF 55% Cardiac cath 10/04/15:Conclusions Diagnostic Procedure Summary 1. Nonobstructive CAD. Mild restenosis of RCA and LCx. 2. Low-normal LV systolic function, EF 50% 3. Inferior hypokinesis. Diagnostic Procedure Recommendations Consider noncardiac causes of the patient's symptoms. Continue medical therapy for nonobstructive CAD. Signatures   Crohn disease (HCC)    Diabetes (HCC)    Diabetic neuropathy associated with type 2 diabetes mellitus (HCC) 10/01/2020   Formatting of this note might be different from the original. Diagnosed  January 2021   Essential hypertension 10/01/2015   Heart attack (HCC) 02/2012   Hyperlipidemia 10/01/2015   Obesity (BMI 30-39.9) 02/16/2020   Progressive angina (HCC) 10/04/2015   Shortness of breath 01/04/2020   Type 1 diabetes mellitus with complications (HCC) 01/04/2020   Type 2 diabetes mellitus, with long-term current use of insulin (HCC) 10/01/2020   Formatting of this note might be different from the original. Diagnosed March, 2003    Past Surgical History:  Procedure Laterality Date   CARDIAC CATHETERIZATION     1 stent placed   CESAREAN SECTION     CHOLECYSTECTOMY     GASTRIC BYPASS     LEFT HEART CATH AND CORONARY ANGIOGRAPHY N/A  01/11/2020   Procedure: LEFT HEART CATH AND CORONARY ANGIOGRAPHY;  Surgeon: Marykay Lex, MD;  Location: [facility] INVASIVE CV LAB;  Service: Cardiovascular;  Laterality: N/A;   PARTIAL HYSTERECTOMY      Prior to Admission medications   Medication Sig Start Date End Date Taking? Authorizing Provider  acetaminophen (TYLENOL) 500 MG tablet Take 1,000 mg by mouth every 8 (eight) hours as needed for moderate pain or headache.   Yes [provider]  atorvastatin (LIPITOR) 40 MG tablet TAKE 1 TABLET(40 MG) BY MOUTH AT BEDTIME 04/14/21  Yes Tobb, Kardie, DO  fluticasone (FLONASE) 50 MCG/ACT nasal spray Place 1 spray into both nostrils daily as needed for allergies or rhinitis.   Yes [provider]  gabapentin (NEURONTIN) 300 MG capsule Take 300 mg by mouth 3 (three) times daily.   Yes [provider]  insulin detemir (LEVEMIR) 100 UNIT/ML injection Inject 32 Units into the skin at bedtime.    Yes [provider]  lisinopril (ZESTRIL) 10 MG tablet TAKE 1 TABLET(10 MG) BY MOUTH TWICE DAILY 09/24/21  Yes Tobb, Kardie, DO  loratadine (CLARITIN) 10 MG tablet Take 10 mg by mouth daily. 01/13/21  Yes [provider]  metFORMIN (GLUCOPHAGE) 500 MG tablet Take 1,000 mg by mouth 2 (two) times daily.   Yes [provider]  omeprazole (PRILOSEC) 40 MG capsule Take 1 capsule (40 mg total) by mouth 2 (two) times daily. Prilosec 40 mg twice daily for 8 weeks. 05/20/22  Yes Jenel Lucks, MD  sucralfate (CARAFATE)  1 g tablet TAKE 1 TABLET BY MOUTH 4 TIMES DAILY FOR 4 WEEKS 07/03/22  Yes Jenel Lucks, MD  albuterol (VENTOLIN HFA) 108 (90 Base) MCG/ACT inhaler Inhale 2 puffs into the lungs every 6 (six) hours as needed for wheezing or shortness of breath.    [provider]  aspirin EC 81 MG tablet Take 81 mg by mouth daily.    [provider]  carvedilol (COREG) 3.125 MG tablet Take 1 tablet (3.125 mg total) by mouth 2 (two) times daily with a  meal. 02/16/20 04/20/22  Tobb, Kardie, DO  nitroGLYCERIN (NITROSTAT) 0.4 MG SL tablet Place 0.4 mg under the tongue every 5 (five) minutes as needed for chest pain.    [provider]  ranolazine (RANEXA) 500 MG 12 hr tablet TAKE 1 TABLET(500 MG) BY MOUTH TWICE DAILY 08/26/21   Tobb, Lavona Mound, DO    Current Outpatient Medications  Medication Sig Dispense Refill   acetaminophen (TYLENOL) 500 MG tablet Take 1,000 mg by mouth every 8 (eight) hours as needed for moderate pain or headache.     atorvastatin (LIPITOR) 40 MG tablet TAKE 1 TABLET(40 MG) BY MOUTH AT BEDTIME 90 tablet 2   fluticasone (FLONASE) 50 MCG/ACT nasal spray Place 1 spray into both nostrils daily as needed for allergies or rhinitis.     gabapentin (NEURONTIN) 300 MG capsule Take 300 mg by mouth 3 (three) times daily.     insulin detemir (LEVEMIR) 100 UNIT/ML injection Inject 32 Units into the skin at bedtime.      lisinopril (ZESTRIL) 10 MG tablet TAKE 1 TABLET(10 MG) BY MOUTH TWICE DAILY 180 tablet 3   loratadine (CLARITIN) 10 MG tablet Take 10 mg by mouth daily.     metFORMIN (GLUCOPHAGE) 500 MG tablet Take 1,000 mg by mouth 2 (two) times daily.     omeprazole (PRILOSEC) 40 MG capsule Take 1 capsule (40 mg total) by mouth 2 (two) times daily. Prilosec 40 mg twice daily for 8 weeks. 90 capsule 1   sucralfate (CARAFATE) 1 g tablet TAKE 1 TABLET BY MOUTH 4 TIMES DAILY FOR 4 WEEKS 90 tablet 1   albuterol (VENTOLIN HFA) 108 (90 Base) MCG/ACT inhaler Inhale 2 puffs into the lungs every 6 (six) hours as needed for wheezing or shortness of breath.     aspirin EC 81 MG tablet Take 81 mg by mouth daily.     carvedilol (COREG) 3.125 MG tablet Take 1 tablet (3.125 mg total) by mouth 2 (two) times daily with a meal. 180 tablet 1   nitroGLYCERIN (NITROSTAT) 0.4 MG SL tablet Place 0.4 mg under the tongue every 5 (five) minutes as needed for chest pain.     ranolazine (RANEXA) 500 MG 12 hr tablet TAKE 1 TABLET(500 MG) BY MOUTH TWICE DAILY 180  tablet 3   Current Facility-Administered Medications  Medication Dose Route Frequency Provider Last Rate Last Admin   0.9 %  sodium chloride infusion  500 mL Intravenous Once Jenel Lucks, MD        Allergies as of 08/06/2022 - Review Complete 08/06/2022  Allergen Reaction Noted   Codeine Itching 10/04/2015   Lidocaine Rash 02/16/2020    Family History  Problem Relation Age of Onset   Stroke Mother    Diabetes Mother    Heart attack Father    Stomach cancer Father    Heart disease Brother    Atrial fibrillation Brother    Heart attack Maternal Grandmother    Diabetes Maternal Grandfather  Stomach cancer Paternal Grandmother     Social History   Socioeconomic History   Marital status: Married    Spouse name: Not on file   Number of children: Not on file   Years of education: Not on file   Highest education level: Not on file  Occupational History   Not on file  Tobacco Use   Smoking status: Former    Types: Cigarettes    Quit date: 01/03/1995    Years since quitting: 27.6   Smokeless tobacco: Never  Substance and Sexual Activity   Alcohol use: Never   Drug use: Never   Sexual activity: Not on file  Other Topics Concern   Not on file  Social History Narrative   Not on file   Social Determinants of Health   Financial Resource Strain: Not on file  Food Insecurity: Not on file  Transportation Needs: Not on file  Physical Activity: Not on file  Stress: Not on file  Social Connections: Not on file  Intimate Partner Violence: Not on file    Review of Systems:  All other review of systems negative except as mentioned in the HPI.  Physical Exam: Vital signs Temp (!) 97.1 F (36.2 C)   General:   Alert,  Well-developed, well-nourished, pleasant and cooperative in NAD Airway:  Mallampati 2 Lungs:  Clear throughout to auscultation.   Heart:  Regular rate and rhythm; no murmurs, clicks, rubs,  or gallops. Abdomen:  Soft, nontender and nondistended.  Normal bowel sounds.   Neuro/Psych:  Normal mood and affect. A and O x 3   Dalonte Hardage E. Tomasa Rand, MD [facility]

## 2022-08-06 NOTE — Op Note (Signed)
[facility] Patient Name: Kelsey Gillespie Procedure Date: 08/06/2022 10:47 AM MRN: [id number] Endoscopist: Lorin Picket E. Tomasa Rand , MD Age: 61 Referring MD:  Date of Birth: 26-Jul-1961 Gender: Female Account #: [id number] Procedure:                Upper GI endoscopy Indications:              Follow-up of gastrojejunal ulcer Medicines:                Monitored Anesthesia Care Procedure:                Pre-Anesthesia Assessment:                           - Prior to the procedure, a History and Physical                            was performed, and patient medications and                            allergies were reviewed. The patient's tolerance of                            previous anesthesia was also reviewed. The risks                            and benefits of the procedure and the sedation                            options and risks were discussed with the patient.                            All questions were answered, and informed consent                            was obtained. Prior Anticoagulants: The patient has                            taken no previous anticoagulant or antiplatelet                            agents. ASA Grade Assessment: III - A patient with                            severe systemic disease. After reviewing the risks                            and benefits, the patient was deemed in                            satisfactory condition to undergo the procedure.                           After obtaining informed consent, the endoscope was  passed under direct vision. Throughout the                            procedure, the patient's blood pressure, pulse, and                            oxygen saturations were monitored continuously. The                            GIF [id number] #[id number] was introduced through the                            mouth, and advanced to the efferent jejunal loop.                            The upper GI  endoscopy was accomplished without                            difficulty. The patient tolerated the procedure                            well. Scope In: Scope Out: 11:00:43 AM Findings:                 The examined portions of the nasopharynx,                            oropharynx and larynx were normal.                           The examined esophagus was normal.                           The gastroesophageal flap valve was visualized                            endoscopically and classified as Hill Grade IV (no                            fold, wide open lumen, hiatal hernia present).                           A small hiatal hernia was present.                           Evidence of a Roux-en-Y gastrojejunostomy was                            found. The gastrojejunal anastomosis was                            characterized by healthy appearing mucosa. The                            gastric pouch appeared slightly enlarged but  otherwise normal with normal appearing mucosa                           The examined jejunum was normal. Complications:            No immediate complications. Estimated Blood Loss:     Estimated blood loss: none. Impression:               - The examined portions of the nasopharynx,                            oropharynx and larynx were normal.                           - Normal esophagus.                           - Gastroesophageal flap valve classified as Hill                            Grade IV (no fold, wide open lumen, hiatal hernia                            present).                           - Roux-en-Y gastrojejunostomy with gastrojejunal                            anastomosis characterized by healthy appearing                            mucosa.                           - Normal examined jejunum.                           - No specimens collected.                           - The previously noted noted ulcers have  healed. Recommendation:           - Patient has a contact number available for                            emergencies. The signs and symptoms of potential                            delayed complications were discussed with the                            patient. Return to normal activities tomorrow.                            Written discharge instructions were provided to the  patient.                           - Resume previous diet.                           - Continue present medications.                           - Stop sucralfate                           - Decrease omeprazole to once daily. Ryleigh Buenger E. Tomasa Rand, MD 08/06/2022 11:09:36 AM This report has been signed electronically.

## 2022-08-06 NOTE — Patient Instructions (Signed)
HANDOUTS PROVIDED ON: HIATAL HERNIA  You may resume your previous diet and medication schedule.  Stop taking the sucrulfate (Carafate) and reduce your omeprazole (Prilosec) to once daily.  Thank you for allowing [place] to care for you today!!!   YOU HAD AN ENDOSCOPIC PROCEDURE TODAY AT THE [facility]:   Refer to the procedure report that was given to you for any specific questions about what was found during the examination.  If the procedure report does not answer your questions, please call your gastroenterologist to clarify.  If you requested that your care partner not be given the details of your procedure findings, then the procedure report has been included in a sealed envelope for you to review at your convenience later.  YOU SHOULD EXPECT: Some feelings of bloating in the abdomen. Passage of more gas than usual.  Walking can help get rid of the air that was put into your GI tract during the procedure and reduce the bloating.   Please Note:  You might notice some irritation and congestion in your nose or some drainage.  This is from the oxygen used during your procedure.  There is no need for concern and it should clear up in a day or so.  SYMPTOMS TO REPORT IMMEDIATELY:  Following upper endoscopy (EGD)  Vomiting of blood or coffee ground material  New chest pain or pain under the shoulder blades  Painful or persistently difficult swallowing  New shortness of breath  Fever of 100F or higher  Black, tarry-looking stools  For urgent or emergent issues, a gastroenterologist can be reached at any hour by calling [phone]. Do not use MyChart messaging for urgent concerns.    DIET:  We do recommend a small meal at first, but then you may proceed to your regular diet.  Drink plenty of fluids but you should avoid alcoholic beverages for 24 hours.  ACTIVITY:  You should plan to take it easy for the rest of today and you should NOT DRIVE or use heavy machinery until  tomorrow (because of the sedation medicines used during the test).    FOLLOW UP: Our staff will call the number listed on your records the next business day following your procedure.  We will call around 7:15- 8:00 am to check on you and address any questions or concerns that you may have regarding the information given to you following your procedure. If we do not reach you, we will leave a message.  If you develop any symptoms (ie: fever, flu-like symptoms, shortness of breath, cough etc.) before then, please call [phone].  If you test positive for Covid 19 in the 2 weeks post procedure, please call and report this information to [place].    If any biopsies were taken you will be contacted by phone or by letter within the next 1-3 weeks.  Please call us at [phone] if you have not heard about the biopsies in 3 weeks.    SIGNATURES/CONFIDENTIALITY: You and/or your care partner have signed paperwork which will be entered into your electronic medical record.  These signatures attest to the fact that that the information above on your After Visit Summary has been reviewed and is understood.  Full responsibility of the confidentiality of this discharge information lies with you and/or your care-partner.

## 2022-08-07 ENCOUNTER — Telehealth: Payer: Self-pay | Admitting: *Deleted

## 2022-08-07 NOTE — Telephone Encounter (Signed)
  Follow up Call-     08/06/2022    9:52 AM 05/20/2022    1:14 PM  Call back number  Post procedure Call Back phone  # [phone]  Permission to leave phone message Yes Yes     Patient questions:  Do you have a fever, pain , or abdominal swelling? No. Pain Score  0 *  Have you tolerated food without any problems? Yes.    Have you been able to return to your normal activities? Yes.    Do you have any questions about your discharge instructions: Diet   No. Medications  No. Follow up visit  No.  Do you have questions or concerns about your Care? No.  Actions: * If pain score is 4 or above: No action needed, pain <4.

## 2022-08-16 ENCOUNTER — Other Ambulatory Visit: Payer: Self-pay | Admitting: Gastroenterology

## 2024-10-17 ENCOUNTER — Other Ambulatory Visit: Payer: Self-pay | Admitting: Medical Genetics
